# Patient Record
Sex: Female | Born: 1965 | Race: White | Hispanic: No | Marital: Married | Smoking: Current every day smoker
Health system: Southern US, Community
[De-identification: ages and names within clinical notes are randomized; demographics above are authoritative.]

## PROBLEM LIST (undated history)

## (undated) DIAGNOSIS — E669 Obesity, unspecified: Secondary | ICD-10-CM

## (undated) DIAGNOSIS — F17200 Nicotine dependence, unspecified, uncomplicated: Secondary | ICD-10-CM

## (undated) DIAGNOSIS — E119 Type 2 diabetes mellitus without complications: Secondary | ICD-10-CM

## (undated) DIAGNOSIS — M1990 Unspecified osteoarthritis, unspecified site: Secondary | ICD-10-CM

## (undated) DIAGNOSIS — F329 Major depressive disorder, single episode, unspecified: Secondary | ICD-10-CM

## (undated) DIAGNOSIS — N200 Calculus of kidney: Secondary | ICD-10-CM

## (undated) DIAGNOSIS — K589 Irritable bowel syndrome without diarrhea: Secondary | ICD-10-CM

## (undated) DIAGNOSIS — M797 Fibromyalgia: Secondary | ICD-10-CM

## (undated) DIAGNOSIS — N390 Urinary tract infection, site not specified: Secondary | ICD-10-CM

## (undated) DIAGNOSIS — K219 Gastro-esophageal reflux disease without esophagitis: Secondary | ICD-10-CM

## (undated) DIAGNOSIS — E785 Hyperlipidemia, unspecified: Secondary | ICD-10-CM

## (undated) DIAGNOSIS — D259 Leiomyoma of uterus, unspecified: Secondary | ICD-10-CM

## (undated) DIAGNOSIS — T7840XA Allergy, unspecified, initial encounter: Secondary | ICD-10-CM

## (undated) DIAGNOSIS — K602 Anal fissure, unspecified: Secondary | ICD-10-CM

## (undated) DIAGNOSIS — F429 Obsessive-compulsive disorder, unspecified: Secondary | ICD-10-CM

## (undated) DIAGNOSIS — F32A Depression, unspecified: Secondary | ICD-10-CM

## (undated) DIAGNOSIS — Z87442 Personal history of urinary calculi: Secondary | ICD-10-CM

## (undated) DIAGNOSIS — R011 Cardiac murmur, unspecified: Secondary | ICD-10-CM

## (undated) DIAGNOSIS — F319 Bipolar disorder, unspecified: Secondary | ICD-10-CM

## (undated) DIAGNOSIS — I10 Essential (primary) hypertension: Secondary | ICD-10-CM

## (undated) HISTORY — DX: Nicotine dependence, unspecified, uncomplicated: F17.200

## (undated) HISTORY — DX: Bipolar disorder, unspecified: F31.9

## (undated) HISTORY — PX: GASTROPLASTY DUODENAL SWITCH: SHX1699

## (undated) HISTORY — DX: Hyperlipidemia, unspecified: E78.5

## (undated) HISTORY — DX: Type 2 diabetes mellitus without complications: E11.9

## (undated) HISTORY — DX: Depression, unspecified: F32.A

## (undated) HISTORY — DX: Gastro-esophageal reflux disease without esophagitis: K21.9

## (undated) HISTORY — PX: COLONOSCOPY: SHX174

## (undated) HISTORY — DX: Urinary tract infection, site not specified: N39.0

## (undated) HISTORY — DX: Anal fissure, unspecified: K60.2

## (undated) HISTORY — DX: Unspecified osteoarthritis, unspecified site: M19.90

## (undated) HISTORY — DX: Calculus of kidney: N20.0

## (undated) HISTORY — DX: Obesity, unspecified: E66.9

## (undated) HISTORY — DX: Essential (primary) hypertension: I10

## (undated) HISTORY — DX: Major depressive disorder, single episode, unspecified: F32.9

## (undated) HISTORY — PX: TYMPANOPLASTY: SHX33

## (undated) HISTORY — PX: UPPER ENDOSCOPY W/ ANTRODUODENAL MANOMETRY: SHX2602

## (undated) HISTORY — PX: FRACTURE SURGERY: SHX138

## (undated) HISTORY — DX: Irritable bowel syndrome, unspecified: K58.9

## (undated) HISTORY — DX: Allergy, unspecified, initial encounter: T78.40XA

## (undated) HISTORY — DX: Fibromyalgia: M79.7

---

## 1997-07-15 ENCOUNTER — Other Ambulatory Visit: Admission: RE | Admit: 1997-07-15 | Discharge: 1997-07-15 | Payer: Self-pay | Admitting: *Deleted

## 2002-01-21 ENCOUNTER — Emergency Department (HOSPITAL_COMMUNITY): Admission: EM | Admit: 2002-01-21 | Discharge: 2002-01-21 | Payer: Self-pay | Admitting: Emergency Medicine

## 2004-05-06 ENCOUNTER — Ambulatory Visit: Payer: Self-pay | Admitting: Obstetrics & Gynecology

## 2005-08-25 ENCOUNTER — Emergency Department: Payer: Self-pay | Admitting: Emergency Medicine

## 2005-08-27 ENCOUNTER — Emergency Department: Payer: Self-pay | Admitting: Emergency Medicine

## 2005-09-07 ENCOUNTER — Emergency Department: Payer: Self-pay | Admitting: Unknown Physician Specialty

## 2005-09-08 ENCOUNTER — Emergency Department: Payer: Self-pay | Admitting: Emergency Medicine

## 2007-10-11 ENCOUNTER — Ambulatory Visit: Payer: Self-pay | Admitting: Obstetrics & Gynecology

## 2009-01-24 ENCOUNTER — Ambulatory Visit: Payer: Self-pay | Admitting: Family Medicine

## 2009-01-30 ENCOUNTER — Ambulatory Visit: Payer: Self-pay | Admitting: Family Medicine

## 2009-03-12 ENCOUNTER — Ambulatory Visit: Payer: Self-pay | Admitting: Gastroenterology

## 2009-04-07 ENCOUNTER — Ambulatory Visit: Payer: Self-pay | Admitting: Gastroenterology

## 2010-01-08 ENCOUNTER — Encounter: Payer: Self-pay | Admitting: Cardiology

## 2010-03-30 ENCOUNTER — Telehealth (INDEPENDENT_AMBULATORY_CARE_PROVIDER_SITE_OTHER): Payer: Self-pay | Admitting: *Deleted

## 2010-04-02 ENCOUNTER — Ambulatory Visit: Payer: Self-pay | Admitting: Cardiology

## 2010-04-02 DIAGNOSIS — R079 Chest pain, unspecified: Secondary | ICD-10-CM

## 2010-04-07 ENCOUNTER — Telehealth: Payer: Self-pay | Admitting: Cardiology

## 2010-07-09 ENCOUNTER — Ambulatory Visit: Payer: Self-pay | Admitting: Obstetrics & Gynecology

## 2010-07-14 NOTE — Letter (Signed)
Summary: [facility] - Visit  [facility] - Visit   Imported By: Marylou Mccoy 05/21/2010 16:02:11  _____________________________________________________________________  External Attachment:    Type:   Image     Comment:   External Document

## 2010-07-14 NOTE — Letter (Signed)
Summary: Medical Record Release  Medical Record Release   Imported By: Harlon Flor 04/02/2010 13:23:49  _____________________________________________________________________  External Attachment:    Type:   Image     Comment:   External Document

## 2010-07-14 NOTE — Assessment & Plan Note (Signed)
Summary: NEW PT   Visit Type:  Initial Consult Primary Provider:  Lyndon Code.  CC:  c/o left side neck stiffness and left arm pain and tingling down the left arm.  Does have chest pain and shortness of breath.  She is under a lot of stress with taking care of mother-in-law who has alzheimer's.  .  History of Present Illness: 45 yo with history of HTN, smoking, and bipolar disorder presents for evaluation of neck pain.  Patient has had left-sided neck pain for months.  She thinks she developed spasticity in her neck initially as a side effect of Geodon.  She saw a chiropractor 6-7 months ago and her neck pain became worse after she was "adjusted."  The pain radiates down her left arm and into her back.  It is constant and never resolves.  It is like a dull ache.  Pain is not exertional.  She is worried that the pain is coming from her heart.    Patient has lost 120 lbs in the last 10 months, she says, with diet and exercise.  She walks 6 times a week for 45 minutes and swims twice a week.  No exertional dyspnea or chest/neck/arm pain.  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  She gets occasional chest pressure that seems to be related to stress (not exertional).  She smokes about 1 ppd.    ECG: NSR, normal  Preventive Screening-Counseling & Management  Alcohol-Tobacco     Smoking Status: current  Caffeine-Diet-Exercise     Does Patient Exercise: yes      Drug Use:  no.    Current Medications (verified): 1)  Amlodipine Besy-Benazepril Hcl 5-40 Mg Caps (Amlodipine Besy-Benazepril Hcl) .... Two Tablets Daily 2)  Bentyl 20 Mg Tabs (Dicyclomine Hcl) .... As Needed  Allergies (verified): 1)  ! * Psycotic Medications  Past History:  Family History: Last updated: 04/02/2010 Father: Living; CABG x 60's. Mother: Living  Maternal grandmother; CHF; living. Paternal grandmother; CHF & CABG x 4. 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 Smokes 1 PPD x 5 years. Alcohol Use - no Drug Use - no Regular Exercise - yes--swims 2 x weekly for 45 min. Walks on the treadmill 6 days a week for 30 min.  Risk Factors: Exercise: yes (04/02/2010)  Risk Factors: Smoking Status: current (04/02/2010)  Past Medical History: 1. Bipolar disorder, diagnosed 2000 2. Hypertension 3. IBS 4. Obese 5. Smoker  Past Surgical History: upper endoscopy colonoscopy  Family History: Reviewed history and no changes required. Father: Living; CABG x 60's. Mother: Living  Maternal grandmother; CHF; living. Paternal grandmother; CHF & CABG x 4. 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 Smokes 1 PPD x 5 years. Alcohol Use - no Drug Use - no Regular Exercise - yes--swims 2 x weekly for 45 min. Walks on the treadmill 6 days a week for 30 min. Smoking Status:  current Drug Use:  no Does Patient Exercise:  yes  Review of Systems       All systems reviewed and negative except as per HPI.   Vital Signs:  Patient profile:   45 year old female Height:      66 inches Weight:      298 pounds BMI:     48.27 Pulse rate:   97 / minute BP sitting:   135 / 91  (  left arm) Cuff size:   large  Vitals Entered By: Bishop Dublin, CMA (April 02, 2010 10:37 AM)  Physical Exam  General:  Well developed, well nourished, in no acute distress. Head:  normocephalic and atraumatic Nose:  no deformity, discharge, inflammation, or lesions Mouth:  Teeth, gums and palate normal. Oral mucosa normal. Neck:  Neck supple, no JVD. No masses, thyromegaly or abnormal cervical nodes. Lungs:  Clear bilaterally to auscultation and percussion. Heart:  Non-displaced PMI, chest non-tender; regular rate and rhythm, S1, S2 without murmurs, rubs or gallops. Carotid upstroke normal, no bruit.  Pedals normal pulses. No edema, no varicosities. Abdomen:  Bowel  sounds positive; abdomen soft and non-tender without masses, organomegaly, or hernias noted. No hepatosplenomegaly. Msk:  Back normal, normal gait. Muscle strength and tone normal. Extremities:  No clubbing or cyanosis. Neurologic:  Alert and oriented x 3. Skin:  Intact without lesions or rashes. Psych:  Pressured speech.    Impression & Recommendations:  Problem # 1:  CHEST PAIN UNSPECIFIED (ICD-786.50) Very atypical chest, neck, and left arm pain.  She does have some risk factors, HTN and smoking.  I think that the pain is noncardiac.  She is very worried, however.  I will have her do an ETT.  If this is normal, no further workup.   Problem # 2:  SMOKING I strongly encouraged her to quit smoking.   Other Orders: Treadmill (Treadmill)  Patient Instructions: 1)  Your physician recommends that you schedule a follow-up appointment in: as needed  2)  Your physician recommends that you return for a FASTING lipid profile: at your convience  3)  Your physician has requested that you have an exercise tolerance test.  For further information please visit [email or web address]  Please also follow instruction sheet, as given.

## 2010-07-14 NOTE — Progress Notes (Signed)
Summary: No show  Phone Note Outgoing Call   Call placed by: Benedict Needy, RN,  April 07, 2010 10:35 AM Call placed to: Patient Summary of Call: Attempted to call pt no answer after 10+ rings. Pt no showed for ETT this morning.  Initial call taken by: Benedict Needy, RN,  April 07, 2010 10:35 AM

## 2010-07-14 NOTE — Progress Notes (Signed)
Summary: CALLED PT  Phone Note Outgoing Call Call back at [facility] Phone [phone] Call back at Work Phone [phone]   Call placed by: Harlon Flor,  March 30, 2010 11:26 AM Call placed to: Patient Summary of Call: CALLED PT TO RESCHEDULE APPT THIS AFTERNOON-THE HOME PHONE # IS DISCONNECTED AND THE WORK # IS INCORRECT Initial call taken by: Harlon Flor,  March 30, 2010 11:27 AM

## 2010-12-29 ENCOUNTER — Encounter: Payer: Self-pay | Admitting: Cardiology

## 2011-01-22 ENCOUNTER — Ambulatory Visit: Payer: Self-pay | Admitting: Cardiovascular Disease

## 2011-01-29 ENCOUNTER — Telehealth: Payer: Self-pay | Admitting: *Deleted

## 2011-02-01 ENCOUNTER — Ambulatory Visit: Payer: Self-pay | Admitting: Cardiovascular Disease

## 2011-02-04 NOTE — Telephone Encounter (Signed)
Error

## 2011-02-08 ENCOUNTER — Ambulatory Visit: Payer: Self-pay | Admitting: Obstetrics & Gynecology

## 2011-05-12 ENCOUNTER — Ambulatory Visit: Payer: Self-pay | Admitting: Urology

## 2011-06-15 HISTORY — PX: BREAST BIOPSY: SHX20

## 2011-09-13 ENCOUNTER — Ambulatory Visit: Payer: Self-pay | Admitting: Physician Assistant

## 2011-11-17 ENCOUNTER — Ambulatory Visit: Payer: Self-pay

## 2012-07-23 IMAGING — US ABDOMEN ULTRASOUND LIMITED
1 series · 17 of 25 positions shown · non-contrast
Comparison: none

REASON FOR EXAM: RUQ pain   abd pain  nausea
COMMENTS:

PROCEDURE:     JUMPER - JUMPER ABDOMEN UPPER GENERAL  - September 13, 2011 [DATE]
RESULT:     History: Pain.
Comparison Study: Prior ultrasound of 01/30/2009. Prior CT of 05/12/2011.

[Series 1: abdomen ultrasound limited · 17 of 63 slices shown]
[im 1/63]
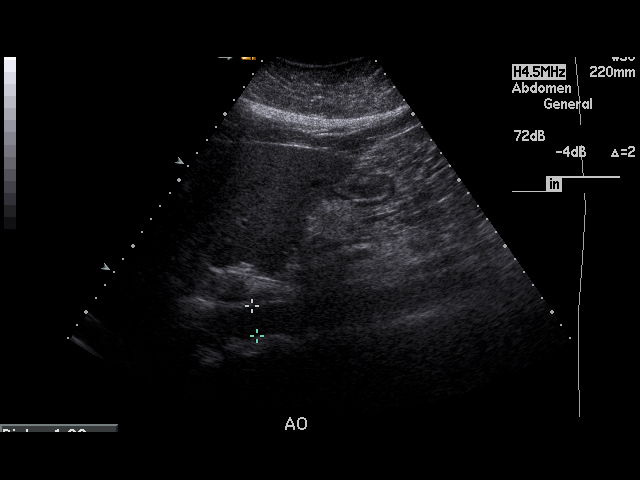
[im 6/63]
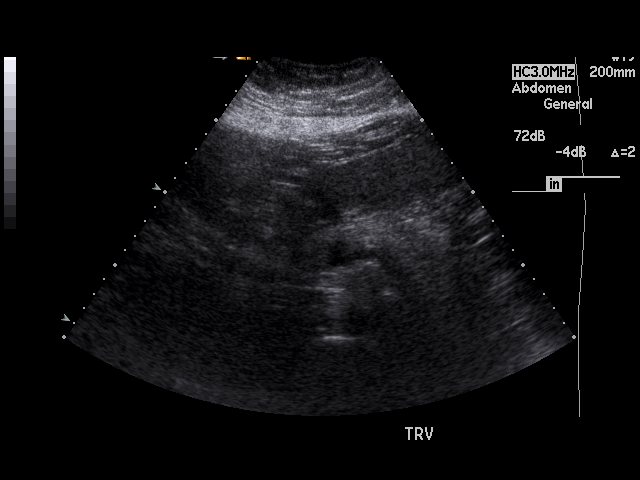
[im 8/63]
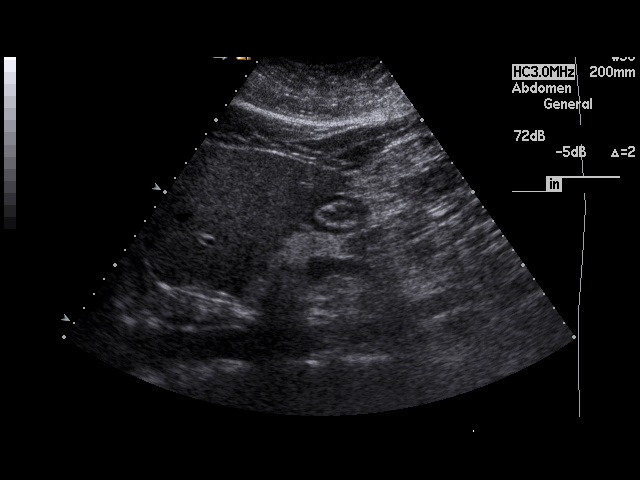
[im 13/63]
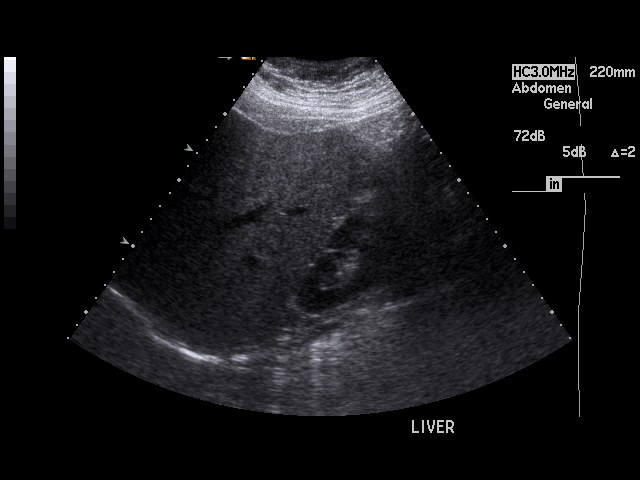
[im 16/63]
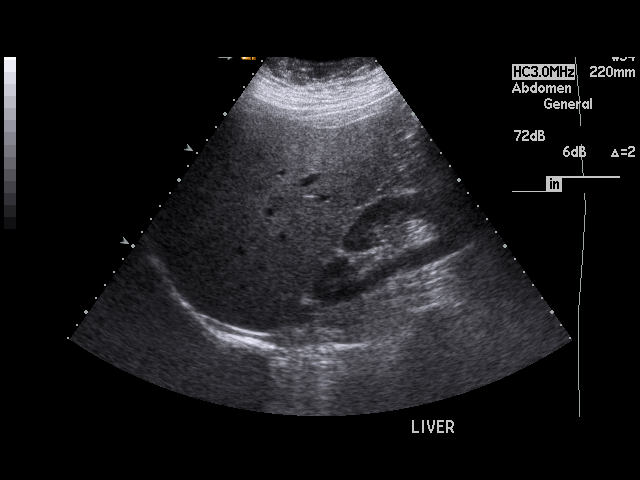
[im 21/63]
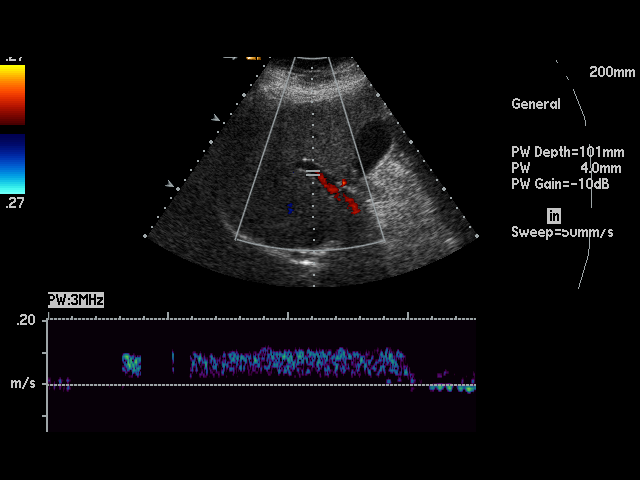
[im 24/63]
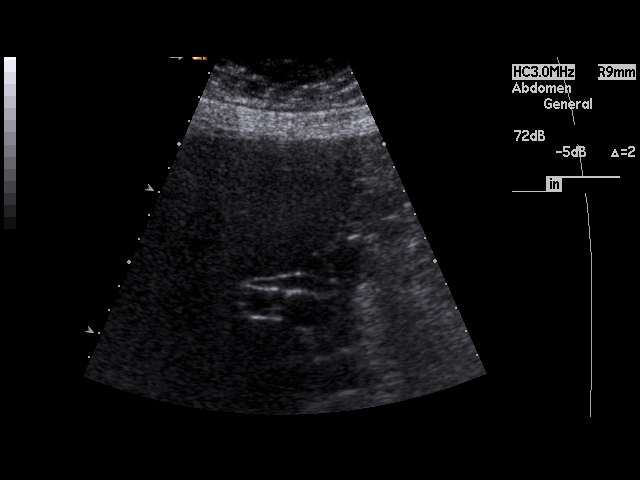
[im 29/63]
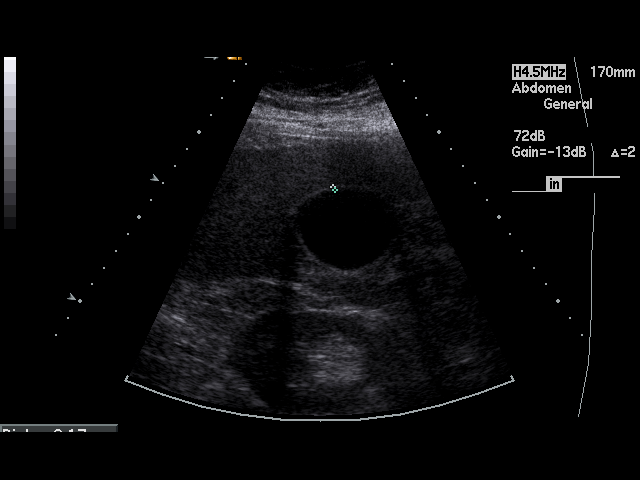
[im 32/63]
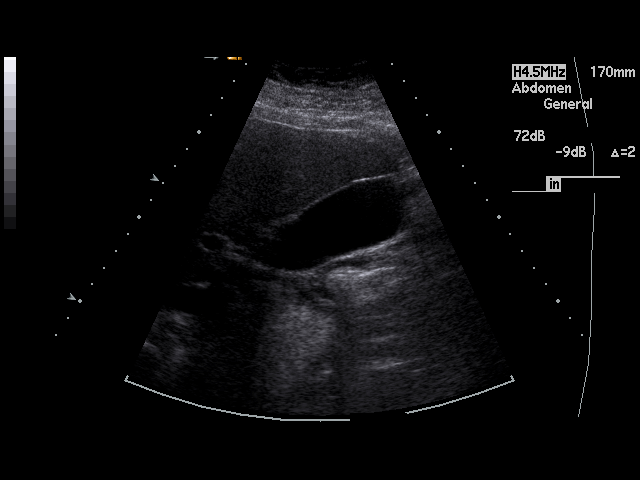
[im 34/63]
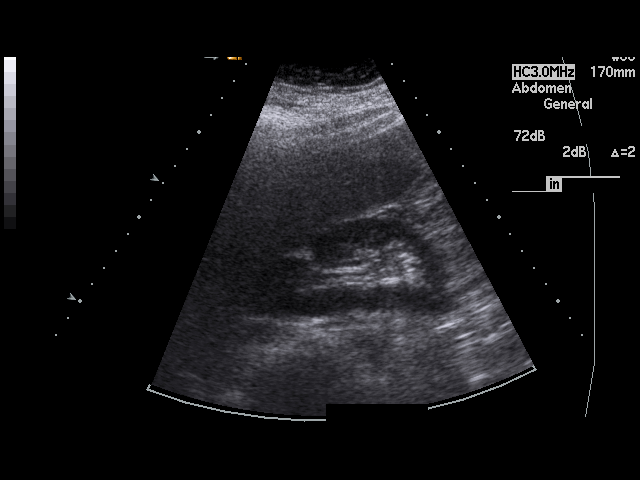
[im 39/63]
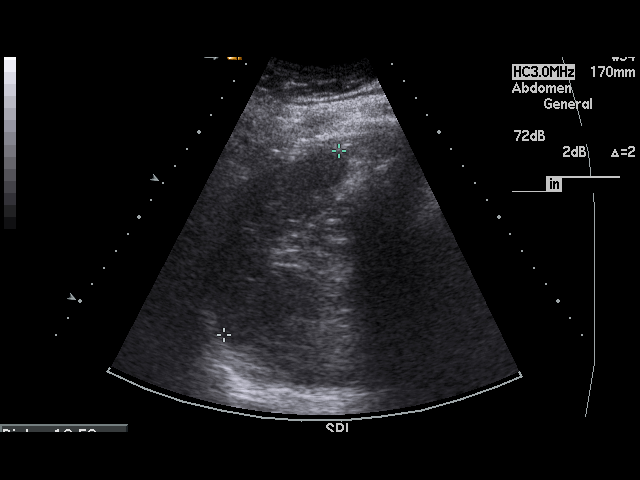
[im 42/63]
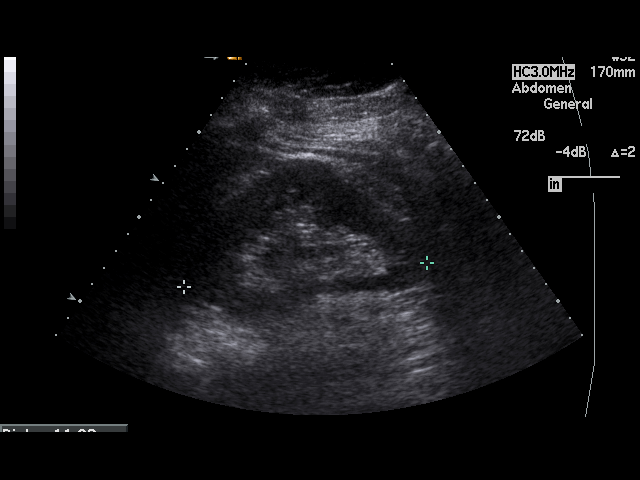
[im 47/63]
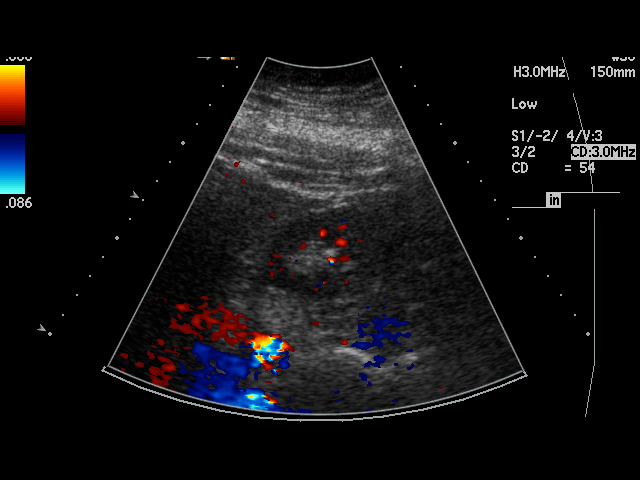
[im 50/63]
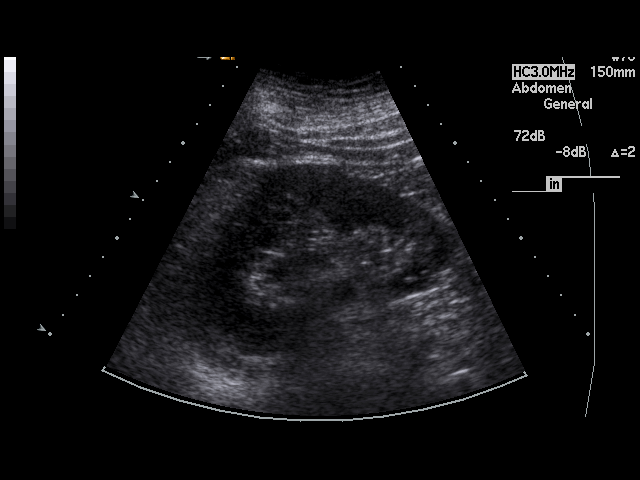
[im 55/63]
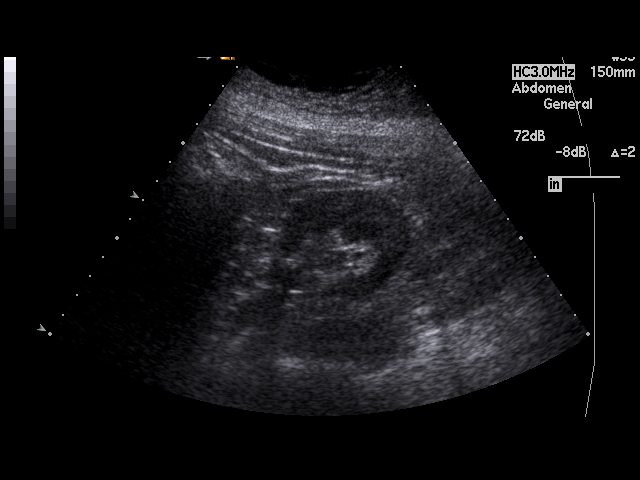
[im 57/63]
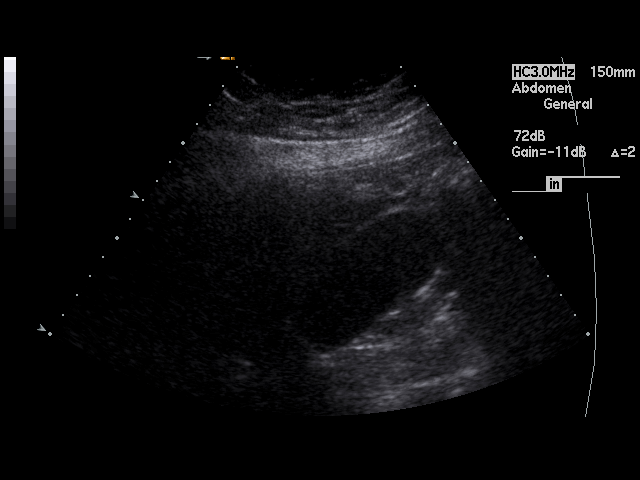
[im 63/63]
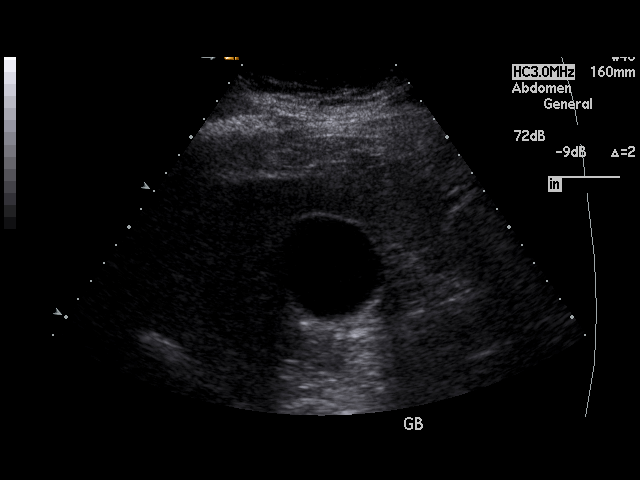

[17 of 25 positions shown; findings below may reference images not displayed]

FINDINGS: Liver mildly echogenic. Pancreas normal. Gallbladder normal.
Negative Murphy's sign. Gallbladder wall thickness 1.7 mm. Common bile duct
diameter 5.2 mm. No hydronephrosis. Bilateral nephrolithiasis present.
IMPRESSION: Slightly echogenic liver, improved from 0646. Otherwise
unremarkable exam.

## 2012-11-29 ENCOUNTER — Ambulatory Visit: Payer: Self-pay | Admitting: Family Medicine

## 2016-08-02 ENCOUNTER — Ambulatory Visit: Payer: Self-pay | Admitting: Family

## 2016-08-02 ENCOUNTER — Telehealth: Payer: Self-pay | Admitting: Family

## 2016-08-02 DIAGNOSIS — Z0289 Encounter for other administrative examinations: Secondary | ICD-10-CM

## 2016-08-02 NOTE — Telephone Encounter (Signed)
FYI- Reason why patient canceled.

## 2016-08-02 NOTE — Telephone Encounter (Signed)
FYI - Pt spouse got called into work and they only have one vehicle.

## 2016-09-07 ENCOUNTER — Ambulatory Visit: Payer: Self-pay | Admitting: Family

## 2016-09-07 ENCOUNTER — Telehealth: Payer: Self-pay | Admitting: Family

## 2016-09-07 NOTE — Telephone Encounter (Signed)
FYI

## 2016-09-07 NOTE — Telephone Encounter (Signed)
Pt cancelled feeling so much better and wants someone else to have the spot that needs it.

## 2016-09-22 ENCOUNTER — Ambulatory Visit: Payer: Self-pay | Admitting: Family

## 2016-09-30 ENCOUNTER — Encounter: Payer: Self-pay | Admitting: Family

## 2016-09-30 ENCOUNTER — Ambulatory Visit (INDEPENDENT_AMBULATORY_CARE_PROVIDER_SITE_OTHER): Payer: BLUE CROSS/BLUE SHIELD | Admitting: Family

## 2016-09-30 VITALS — BP 150/98 | HR 100 | Temp 99.6°F | Ht 66.0 in | Wt >= 6400 oz

## 2016-09-30 DIAGNOSIS — K589 Irritable bowel syndrome without diarrhea: Secondary | ICD-10-CM | POA: Diagnosis not present

## 2016-09-30 DIAGNOSIS — I10 Essential (primary) hypertension: Secondary | ICD-10-CM | POA: Diagnosis not present

## 2016-09-30 MED ORDER — BENAZEPRIL HCL 20 MG PO TABS: 20.0000 mg | ORAL_TABLET | Freq: Every day | ORAL | 3 refills | Status: DC

## 2016-09-30 MED ORDER — AMLODIPINE BESYLATE 5 MG PO TABS: 5.0000 mg | ORAL_TABLET | Freq: Every day | ORAL | 0 refills | Status: DC

## 2016-09-30 NOTE — Patient Instructions (Addendum)
Increase lisinopril Monitor BP , goal less than 140/90.  Labs today and again Monday or Tuesday morning   Walking program 10  Minutes EVERY DAY and then increase to 20 minutes every day the next week.   This is  Dr. Lupita Dawn  example of a  "Low GI"  Diet:  It will allow you to lose 4 to 8  lbs  per month if you follow it carefully.  Your goal with exercise is a minimum of 30 minutes of aerobic exercise 5 days per week (Walking does not count once it becomes easy!)    All of the foods can be found at grocery stores and in bulk at [employer].  The Atkins protein bars and shakes are available in more varieties at Target, [employer] and [employer].     7 AM Breakfast:  Choose from the following:  Low carbohydrate Protein  Shakes (I recommend the  Premier Protein chocolate shakes,  EAS AdvantEdge "Carb Control" shakes  Or the Atkins shakes all are under 3 net carbs)     a scrambled egg/bacon/cheese burrito made with Mission's "carb balance" whole wheat tortilla  (about 10 net carbs )  Regulatory affairs officer (basically a quiche without the pastry crust) that is eaten cold and very convenient way to get your eggs.  8 carbs)  If you make your own protein shakes, avoid bananas and pineapple,  And use low carb greek yogurt or original /unsweetened almond or soy milk    Avoid cereal and bananas, oatmeal and cream of wheat and grits. They are loaded with carbohydrates!   10 AM: high protein snack:  Protein bar by Atkins (the snack size, under 200 cal, usually < 6 net carbs).    A stick of cheese:  Around 1 carb,  100 cal     Dannon Light n Fit [place] Yogurt  (80 cal, 8 carbs)  Other so called "protein bars" and Greek yogurts tend to be loaded with carbohydrates.  Remember, in food advertising, the word "energy" is synonymous for " carbohydrate."  Lunch:   A Sandwich using the bread choices listed, Can use any  Eggs,  lunchmeat, grilled meat or canned tuna), avocado, regular  mayo/mustard  and cheese.  A Salad using blue cheese, ranch,  Goddess or vinagrette,  Avoid taco shells, croutons or "confetti" and no "candied nuts" but regular nuts OK.   No pretzels, nabs  or chips.  Pickles and miniature sweet peppers are a good low carb alternative that provide a "crunch"  The bread is the only source of carbohydrate in a sandwich and  can be decreased by trying some of the attached alternatives to traditional loaf bread   Avoid "Low fat dressings, as well as [place] dressings They are loaded with sugar!   3 PM/ Mid day  Snack:  Consider  1 ounce of  almonds, walnuts, pistachios, pecans, peanuts,  Macadamia nuts or a nut medley.  Avoid "granola and granola bars "  Mixed nuts are ok in moderation as long as there are no raisins,  cranberries or dried fruit.   KIND bars are OK if you get the low glycemic index variety   Try the prosciutto/mozzarella cheese sticks by Fiorruci  In deli /backery section   High protein      6 PM  Dinner:     Meat/fowl/fish with a green salad, and either broccoli, cauliflower, green beans, spinach, brussel sprouts or  Lima beans. DO  NOT BREAD THE PROTEIN!!      There is a low carb pasta by Dreamfield's that is acceptable and tastes great: only 5 digestible carbs/serving.( All grocery stores but BJs carry it ) Several ready made meals are available low carb:   Try Michel Angelo's chicken piccata or chicken or eggplant parm over low carb pasta.(Lowes and BJs)   Marjory Lies Sanchez's "Carnitas" (pulled pork, no sauce,  0 carbs) or his beef pot roast to make a dinner burrito (at [employer]J's)  Pesto over low carb pasta (bj's sells a good quality pesto in the center refrigerated section of the deli   Try satueeing  Cheral Marker with mushroooms as a good side   [employer] Giant makes a mashed cauliflower that tastes like mashed potatoes  Whole wheat pasta is still full of digestible carbs and  Not as low in glycemic index as Dreamfield's.   Brown  rice is still rice,  So skip the rice and noodles if you eat [place] or [place] (or at least limit to 1/2 cup)  9 PM snack :   Breyer's "low carb" fudgsicle or  ice cream bar (Carb Smart line), or  Weight Watcher's ice cream bar , or another "no sugar added" ice cream;  a serving of fresh berries/cherries with whipped cream   Cheese or DANNON'S LlGHT N FIT GREEK YOGURT  8 ounces of [place] unsweetened almond/cococunut milk    Treat yourself to a parfait made with whipped cream blueberiies, walnuts and vanilla greek yogurt  Avoid bananas, pineapple, grapes  and watermelon on a regular basis because they are high in sugar.  THINK OF THEM AS DESSERT  Remember that snack Substitutions should be less than 10 NET carbs per serving and meals < 20 carbs. Remember to subtract fiber grams to get the "net carbs."    DASH Eating Plan DASH stands for "Dietary Approaches to Stop Hypertension." The DASH eating plan is a healthy eating plan that has been shown to reduce high blood pressure (hypertension). It may also reduce your risk for type 2 diabetes, heart disease, and stroke. The DASH eating plan may also help with weight loss. What are tips for following this plan? General guidelines   Avoid eating more than 2,300 mg (milligrams) of salt (sodium) a day. If you have hypertension, you may need to reduce your sodium intake to 1,500 mg a day.  Limit alcohol intake to no more than 1 drink a day for nonpregnant women and 2 drinks a day for men. One drink equals 12 oz of beer, 5 oz of wine, or 1 oz of hard liquor.  Work with your health care provider to maintain a healthy body weight or to lose weight. Ask what an ideal weight is for you.  Get at least 30 minutes of exercise that causes your heart to beat faster (aerobic exercise) most days of the week. Activities may include walking, swimming, or biking.  Work with your health care provider or diet and nutrition specialist (dietitian) to adjust your  eating plan to your individual calorie needs. Reading food labels   Check food labels for the amount of sodium per serving. Choose foods with less than 5 percent of the Daily Value of sodium. Generally, foods with less than 300 mg of sodium per serving fit into this eating plan.  To find whole grains, look for the word "whole" as the first word in the ingredient list. Shopping   Buy products labeled as "low-sodium" or "no salt added."  Buy fresh foods. Avoid canned foods and premade or frozen meals. Cooking   Avoid adding salt when cooking. Use salt-free seasonings or herbs instead of table salt or sea salt. Check with your health care provider or pharmacist before using salt substitutes.  Do not fry foods. Cook foods using healthy methods such as baking, boiling, grilling, and broiling instead.  Cook with heart-healthy oils, such as olive, canola, soybean, or sunflower oil. Meal planning    Eat a balanced diet that includes:  5 or more servings of fruits and vegetables each day. At each meal, try to fill half of your plate with fruits and vegetables.  Up to 6-8 servings of whole grains each day.  Less than 6 oz of lean meat, poultry, or fish each day. A 3-oz serving of meat is about the same size as a deck of cards. One egg equals 1 oz.  2 servings of low-fat dairy each day.  A serving of nuts, seeds, or beans 5 times each week.  Heart-healthy fats. Healthy fats called Omega-3 fatty acids are found in foods such as flaxseeds and coldwater fish, like sardines, salmon, and mackerel.  Limit how much you eat of the following:  Canned or prepackaged foods.  Food that is high in trans fat, such as fried foods.  Food that is high in saturated fat, such as fatty meat.  Sweets, desserts, sugary drinks, and other foods with added sugar.  Full-fat dairy products.  Do not salt foods before eating.  Try to eat at least 2 vegetarian meals each week.  Eat more home-cooked food  and less restaurant, buffet, and fast food.  When eating at a restaurant, ask that your food be prepared with less salt or no salt, if possible. What foods are recommended? The items listed may not be a complete list. Talk with your dietitian about what dietary choices are best for you. Grains  Whole-grain or whole-wheat bread. Whole-grain or whole-wheat pasta. Brown rice. [place]. Bulgur. Whole-grain and low-sodium cereals. Pita bread. Low-fat, low-sodium crackers. Whole-wheat flour tortillas. Vegetables  Fresh or frozen vegetables (raw, steamed, roasted, or grilled). Low-sodium or reduced-sodium tomato and vegetable juice. Low-sodium or reduced-sodium tomato sauce and tomato paste. Low-sodium or reduced-sodium canned vegetables. Fruits  All fresh, dried, or frozen fruit. Canned fruit in natural juice (without added sugar). Meat and other protein foods  Skinless chicken or [place]. Ground chicken or [place]. Pork with fat trimmed off. Fish and seafood. Egg whites. Dried beans, peas, or lentils. Unsalted nuts, nut butters, and seeds. Unsalted canned beans. Lean cuts of beef with fat trimmed off. Low-sodium, lean deli meat. Dairy  Low-fat (1%) or fat-free (skim) milk. Fat-free, low-fat, or reduced-fat cheeses. Nonfat, low-sodium ricotta or cottage cheese. Low-fat or nonfat yogurt. Low-fat, low-sodium cheese. Fats and oils  Soft margarine without trans fats. Vegetable oil. Low-fat, reduced-fat, or light mayonnaise and salad dressings (reduced-sodium). Canola, safflower, olive, soybean, and sunflower oils. Avocado. Seasoning and other foods  Herbs. Spices. Seasoning mixes without salt. Unsalted popcorn and pretzels. Fat-free sweets. What foods are not recommended? The items listed may not be a complete list. Talk with your dietitian about what dietary choices are best for you. Grains  Baked goods made with fat, such as croissants, muffins, or some breads. Dry pasta or rice meal  packs. Vegetables  Creamed or fried vegetables. Vegetables in a cheese sauce. Regular canned vegetables (not low-sodium or reduced-sodium). Regular canned tomato sauce and paste (not low-sodium or reduced-sodium). Regular tomato and vegetable  juice (not low-sodium or reduced-sodium). Angie Fava. Olives. Fruits  Canned fruit in a light or heavy syrup. Fried fruit. Fruit in cream or butter sauce. Meat and other protein foods  Fatty cuts of meat. Ribs. Fried meat. Berniece Salines. Sausage. Bologna and other processed lunch meats. Salami. Fatback. Hotdogs. Bratwurst. Salted nuts and seeds. Canned beans with added salt. Canned or smoked fish. Whole eggs or egg yolks. Chicken or [place] with skin. Dairy  Whole or 2% milk, cream, and half-and-half. Whole or full-fat cream cheese. Whole-fat or sweetened yogurt. Full-fat cheese. Nondairy creamers. Whipped toppings. Processed cheese and cheese spreads. Fats and oils  Butter. Stick margarine. Lard. Shortening. Ghee. Bacon fat. Tropical oils, such as coconut, palm kernel, or palm oil. Seasoning and other foods  Salted popcorn and pretzels. Onion salt, garlic salt, seasoned salt, table salt, and sea salt. Worcestershire sauce. Tartar sauce. Barbecue sauce. Teriyaki sauce. Soy sauce, including reduced-sodium. Steak sauce. Canned and packaged gravies. Fish sauce. Oyster sauce. Cocktail sauce. Horseradish that you find on the shelf. Ketchup. Mustard. Meat flavorings and tenderizers. Bouillon cubes. Hot sauce and Tabasco sauce. Premade or packaged marinades. Premade or packaged taco seasonings. Relishes. Regular salad dressings. Where to find more information:  National Heart, Lung, and [place]: https://wilson-eaton.com/  American Heart Association: www.heart.org Summary  The DASH eating plan is a healthy eating plan that has been shown to reduce high blood pressure (hypertension). It may also reduce your risk for type 2 diabetes, heart disease, and stroke.  With the DASH  eating plan, you should limit salt (sodium) intake to 2,300 mg a day. If you have hypertension, you may need to reduce your sodium intake to 1,500 mg a day.  When on the DASH eating plan, aim to eat more fresh fruits and vegetables, whole grains, lean proteins, low-fat dairy, and heart-healthy fats.  Work with your health care provider or diet and nutrition specialist (dietitian) to adjust your eating plan to your individual calorie needs. This information is not intended to replace advice given to you by your health care provider. Make sure you discuss any questions you have with your health care provider. Document Released: 05/20/2011 Document Revised: 05/24/2016 Document Reviewed: 05/24/2016 Elsevier Interactive Patient Education  2017 [employer].

## 2016-09-30 NOTE — Progress Notes (Signed)
Pre visit review using our clinic review tool, if applicable. No additional management support is needed unless otherwise documented below in the visit note. 

## 2016-09-30 NOTE — Progress Notes (Signed)
Subjective:    Patient ID: Kathryn Schneider, female    DOB: [birth year], 51 y.o.   MRN: [id number]  CC: Kathryn Schneider is a 51 y.o. female who presents today to establish care.    HPI: From [facility] in [place], last PCP in 2011.   Weight gain- most frustrated by this. 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 Not exercising currently. Skips meals and eats big dinner. Complains of diet causing a lot of gas and abdominal bloating, worsening IBS.   IBS- fluctuates between diarrhea and constipation. Has seen GI, takes zofran. Last colonoscopy 2011 at [facility]. 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   HTN- Compliant with medication.Denies exertional chest pain or pressure, numbness or tingling radiating to left arm or jaw, palpitations, dizziness, frequent headaches, changes in vision, or shortness of breath.    Will return for pap. Due for CPE, mammogram.       HISTORY:  Past Medical History:  Diagnosis Date  . Allergy   . Anal fissure   . Arthritis   . Bipolar disorder ([place])    diagnosed 2000  . Depression   . Diabetes ([place])   . Fibromyalgia   . GERD (gastroesophageal reflux disease)   . HTN (hypertension)   . Hyperlipidemia   . IBS (irritable bowel syndrome)   . Kidney stones   . Obese   . Smoker   . UTI (urinary tract infection)    Past Surgical History:  Procedure Laterality Date  . BREAST BIOPSY  2013  . COLONOSCOPY    . UPPER ENDOSCOPY W/ ANTRODUODENAL MANOMETRY     Family History  Problem Relation Age of Onset  . Depression Mother   . Arthritis Mother   . Osteoporosis Mother   . Vasculitis Father     wegners,   . Cataracts Father   . Atrial fibrillation Father   . Diabetes Father   . Heart disease Father   . Arthritis Father   . Heart disease Maternal Grandmother   . Heart disease Paternal Grandmother     Allergies: Patient has no allergy information on record. No current outpatient prescriptions on file prior to  visit.   No current facility-administered medications on file prior to visit.     Social History  Substance Use Topics  . Smoking status: Current Every Day Smoker  . Smokeless tobacco: Not on file     Comment: 1 ppd x 5 years  . Alcohol use No    Review of Systems  Constitutional: Negative for chills and fever.  Eyes: Negative for visual disturbance.  Respiratory: Negative for cough.   Cardiovascular: Negative for chest pain and palpitations.  Gastrointestinal: Positive for constipation and diarrhea. Negative for abdominal pain, nausea and vomiting.  Musculoskeletal: Negative for arthralgias and myalgias.  Skin: Negative for rash.  Neurological: Negative for headaches.  Hematological: Negative for adenopathy.      Objective:    BP (!) 150/98   Pulse 100   Temp 99.6 F (37.6 C) (Oral)   Ht 5\' 6"  (1.676 m)   Wt (!) 425 lb 3.2 oz (192.9 kg)   SpO2 95%   BMI 68.63 kg/m  BP Readings from Last 3 Encounters:  09/30/16 (!) 150/98  04/02/10 (!) 135/91   Wt Readings from Last 3 Encounters:  09/30/16 (!) 425 lb 3.2 oz (192.9 kg)  04/02/10 (!) 298 lb (135.2 kg)    Physical Exam  Constitutional: She appears well-developed and  well-nourished.  Eyes: Conjunctivae are normal.  Cardiovascular: Normal rate, regular rhythm, normal heart sounds and normal pulses.   Pulmonary/Chest: Effort normal and breath sounds normal. She has no wheezes. She has no rhonchi. She has no rales.  Neurological: She is alert.  Skin: Skin is warm and dry.  Psychiatric: She has a normal mood and affect. Her speech is normal and behavior is normal. Thought content normal.  Vitals reviewed.      Assessment & Plan:   Problem List Items Addressed This Visit      Cardiovascular and Mediastinum   HTN (hypertension) - Primary    Elevated today. Will increase lotensin. Pending bmp today and then repeat in 5 days. Follow up one month.       Relevant Medications   amLODipine (NORVASC) 5 MG tablet    benazepril (LOTENSIN) 20 MG tablet   Other Relevant Orders   Comprehensive metabolic panel (Completed)   Basic metabolic panel     Digestive   IBS (irritable bowel syndrome)    Discussed weight gain and diet as likely contributors. Agreed to focus on weight management to help with symptoms of IBS.       Relevant Medications   ondansetron (ZOFRAN-ODT) 4 MG disintegrating tablet     Other   Morbid obesity ([place])    Most important complaint to address, patient and I agree this is priority. We spent a lot of time discussing dietary changes ( information given) and I have advised to start walking program. f/u one month.           I have discontinued [name]'s amLODipine-benazepril, dicyclomine, and benazepril. I have also changed her amLODipine. Additionally, I am having her start on benazepril. Lastly, I am having her maintain her ondansetron.   Meds ordered this encounter  Medications  . ondansetron (ZOFRAN-ODT) 4 MG disintegrating tablet    Sig: Take by mouth.  . DISCONTD: benazepril (LOTENSIN) 10 MG tablet  . DISCONTD: amLODipine (NORVASC) 5 MG tablet  . amLODipine (NORVASC) 5 MG tablet    Sig: Take 1 tablet (5 mg total) by mouth daily.    Dispense:  90 tablet    Refill:  0    Order Specific Question:   Supervising Provider    Answer:   Kathryn Schneider [[id number]]  . benazepril (LOTENSIN) 20 MG tablet    Sig: Take 1 tablet (20 mg total) by mouth daily.    Dispense:  90 tablet    Refill:  3    Order Specific Question:   Supervising Provider    Answer:   Kathryn Schneider [[id number]]    Return precautions given.   Risks, benefits, and alternatives of the medications and treatment plan prescribed today were discussed, and patient expressed understanding.   Education regarding symptom management and diagnosis given to patient on AVS.  Continue to follow with Kathryn Paris, FNP for routine health maintenance.   Kathryn Schneider and I agreed with plan.   Kathryn Paris,  FNP

## 2016-10-01 LAB — COMPREHENSIVE METABOLIC PANEL
ALBUMIN: 4.2 g/dL (ref 3.5–5.2)
ALK PHOS: 71 U/L (ref 39–117)
ALT: 29 U/L (ref 0–35)
AST: 21 U/L (ref 0–37)
BILIRUBIN TOTAL: 0.3 mg/dL (ref 0.2–1.2)
BUN: 17 mg/dL (ref 6–23)
CALCIUM: 9.1 mg/dL (ref 8.4–10.5)
CO2: 31 meq/L (ref 19–32)
CREATININE: 0.79 mg/dL (ref 0.40–1.20)
Chloride: 102 mEq/L (ref 96–112)
GFR: 82.07 mL/min (ref >60.00)
Glucose, Bld: 106 mg/dL — ABNORMAL HIGH (ref 70–99)
Potassium: 4.1 mEq/L (ref 3.5–5.1)
Sodium: 138 mEq/L (ref 135–145)
TOTAL PROTEIN: 7.7 g/dL (ref 6.0–8.3)

## 2016-10-04 ENCOUNTER — Encounter: Payer: Self-pay | Admitting: Family

## 2016-10-04 DIAGNOSIS — K589 Irritable bowel syndrome without diarrhea: Secondary | ICD-10-CM | POA: Insufficient documentation

## 2016-10-05 ENCOUNTER — Encounter: Payer: Self-pay | Admitting: Family

## 2016-10-05 DIAGNOSIS — I10 Essential (primary) hypertension: Secondary | ICD-10-CM | POA: Insufficient documentation

## 2016-10-05 NOTE — Assessment & Plan Note (Signed)
Discussed weight gain and diet as likely contributors. Agreed to focus on weight management to help with symptoms of IBS.

## 2016-10-05 NOTE — Assessment & Plan Note (Signed)
Most important complaint to address, patient and I agree this is priority. We spent a lot of time discussing dietary changes ( information given) and I have advised to start walking program. f/u one month.

## 2016-10-05 NOTE — Assessment & Plan Note (Signed)
Elevated today. Will increase lotensin. Pending bmp today and then repeat in 5 days. Follow up one month.

## 2016-10-06 ENCOUNTER — Other Ambulatory Visit: Payer: PRIVATE HEALTH INSURANCE

## 2016-10-27 ENCOUNTER — Other Ambulatory Visit: Payer: BLUE CROSS/BLUE SHIELD

## 2016-10-28 ENCOUNTER — Other Ambulatory Visit: Payer: BLUE CROSS/BLUE SHIELD

## 2016-10-29 ENCOUNTER — Other Ambulatory Visit: Payer: BLUE CROSS/BLUE SHIELD

## 2016-10-29 NOTE — Addendum Note (Signed)
Addended by: Arby Barrette on: 10/29/2016 11:11 AM   Modules accepted: Orders

## 2016-11-01 ENCOUNTER — Ambulatory Visit: Payer: BLUE CROSS/BLUE SHIELD | Admitting: Family

## 2016-11-15 ENCOUNTER — Ambulatory Visit (INDEPENDENT_AMBULATORY_CARE_PROVIDER_SITE_OTHER): Payer: BLUE CROSS/BLUE SHIELD | Admitting: Family

## 2016-11-15 VITALS — BP 160/100 | HR 98 | Temp 98.1°F | Ht 66.0 in | Wt >= 6400 oz

## 2016-11-15 DIAGNOSIS — I10 Essential (primary) hypertension: Secondary | ICD-10-CM | POA: Diagnosis not present

## 2016-11-15 DIAGNOSIS — Z1239 Encounter for other screening for malignant neoplasm of breast: Secondary | ICD-10-CM

## 2016-11-15 DIAGNOSIS — Z1211 Encounter for screening for malignant neoplasm of colon: Secondary | ICD-10-CM

## 2016-11-15 DIAGNOSIS — Z1231 Encounter for screening mammogram for malignant neoplasm of breast: Secondary | ICD-10-CM

## 2016-11-15 MED ORDER — HYDROCHLOROTHIAZIDE 12.5 MG PO CAPS: 12.5000 mg | ORAL_CAPSULE | Freq: Every day | ORAL | 0 refills | Status: DC

## 2016-11-15 NOTE — Patient Instructions (Signed)
Start hctz  Repeat electrolytes on Friday  Follow up with physical

## 2016-11-15 NOTE — Progress Notes (Signed)
Subjective:    Patient ID: Kathryn Schneider, female    DOB: [birth year], 51 y.o.   MRN: [id number]  CC: Kathryn Schneider is a 51 y.o. female who presents today for follow up.   HPI: 2 months ago started norvasc and lotensin.   Weight loss - lost 3 inches off waist. Lost 4 pounds.  Eating healthier and has starting swimming.   HTN-compliant with medication. Does not leg swelling around ankles, worse at end of day.   Denies exertional chest pain or pressure, numbness or tingling radiating to left arm or jaw, palpitations, dizziness, frequent headaches, changes in vision, or shortness of breath.        HISTORY:  Past Medical History:  Diagnosis Date  . Allergy   . Anal fissure   . Arthritis   . Bipolar disorder ([place])    diagnosed 2000  . Depression   . Diabetes ([place])   . Fibromyalgia   . GERD (gastroesophageal reflux disease)   . HTN (hypertension)   . Hyperlipidemia   . IBS (irritable bowel syndrome)   . Kidney stones   . Obese   . Smoker   . UTI (urinary tract infection)    Past Surgical History:  Procedure Laterality Date  . BREAST BIOPSY  2013  . COLONOSCOPY    . UPPER ENDOSCOPY W/ ANTRODUODENAL MANOMETRY     Family History  Problem Relation Age of Onset  . Depression Mother   . Arthritis Mother   . Osteoporosis Mother   . Vasculitis Father        wegners,   . Cataracts Father   . Atrial fibrillation Father   . Diabetes Father   . Heart disease Father   . Arthritis Father   . Heart disease Maternal Grandmother   . Heart disease Paternal Grandmother     Allergies: Patient has no allergy information on record. Current Outpatient Prescriptions on File Prior to Visit  Medication Sig Dispense Refill  . amLODipine (NORVASC) 5 MG tablet Take 1 tablet (5 mg total) by mouth daily. 90 tablet 0  . benazepril (LOTENSIN) 20 MG tablet Take 1 tablet (20 mg total) by mouth daily. 90 tablet 3   No current facility-administered medications on file prior to visit.      Social History  Substance Use Topics  . Smoking status: Current Every Day Smoker  . Smokeless tobacco: Never Used     Comment: 1 ppd x 5 years  . Alcohol use No    Review of Systems  Constitutional: Negative for chills and fever.  Eyes: Negative for visual disturbance.  Respiratory: Negative for cough and shortness of breath.   Cardiovascular: Positive for leg swelling. Negative for chest pain and palpitations.  Gastrointestinal: Negative for nausea and vomiting.  Neurological: Negative for headaches.      Objective:    BP (!) 160/100   Pulse 98   Temp 98.1 F (36.7 C) (Oral)   Ht 5\' 6"  (1.676 m)   Wt (!) 421 lb 6.4 oz (191.1 kg)   SpO2 97%   BMI 68.02 kg/m  BP Readings from Last 3 Encounters:  11/15/16 (!) 160/100  09/30/16 (!) 150/98  04/02/10 (!) 135/91   Wt Readings from Last 3 Encounters:  11/15/16 (!) 421 lb 6.4 oz (191.1 kg)  09/30/16 (!) 425 lb 3.2 oz (192.9 kg)  04/02/10 (!) 298 lb (135.2 kg)    Physical Exam  Constitutional: She appears well-developed and well-nourished.  Eyes: Conjunctivae are normal.  Cardiovascular: Normal rate, regular rhythm, normal heart sounds and normal pulses.   Trace pedal bilateral edema, non pitting. No palpable cords or masses. No erythema or increased warmth. No asymmetry in calf size when compared bilaterally LE hair growth symmetric and present. No discoloration of varicosities noted. LE warm and palpable pedal pulses.     Pulmonary/Chest: Effort normal and breath sounds normal. She has no wheezes. She has no rhonchi. She has no rales.  Neurological: She is alert.  Skin: Skin is warm and dry.  Psychiatric: She has a normal mood and affect. Her speech is normal and behavior is normal. Thought content normal.  Vitals reviewed.      Assessment & Plan:   Problem List Items Addressed This Visit      Cardiovascular and Mediastinum   HTN (hypertension) - Primary    Not at goal. Addede hctz due to LE swelling.  Pending repeat bmp. Follow up 2 weeks.       Relevant Medications   hydrochlorothiazide (MICROZIDE) 12.5 MG capsule   Other Relevant Orders   Basic metabolic panel (Completed)   Basic metabolic panel     Other   Morbid obesity ([place])    Congratulated patient on notable lifestyle changes. Encouraged to continue. Will follow      Screening for colon cancer    Ordered.       Relevant Orders   Ambulatory referral to Gastroenterology   Screening for breast cancer    Patient will schedule      Relevant Orders   MM SCREENING BREAST TOMO BILATERAL       I am having Kathryn Schneider start on hydrochlorothiazide. I am also having her maintain her amLODipine, benazepril, and meloxicam.   Meds ordered this encounter  Medications  . meloxicam (MOBIC) 15 MG tablet    Sig: TAKE 1 TABLET (15 MG TOTAL) BY MOUTH ONCE DAILY.    Refill:  11  . hydrochlorothiazide (MICROZIDE) 12.5 MG capsule    Sig: Take 1 capsule (12.5 mg total) by mouth daily.    Dispense:  90 capsule    Refill:  0    Order Specific Question:   Supervising Provider    Answer:   Crecencio Mc [[id number]]    Return precautions given.   Risks, benefits, and alternatives of the medications and treatment plan prescribed today were discussed, and patient expressed understanding.   Education regarding symptom management and diagnosis given to patient on AVS.  Continue to follow with Kathryn Hawthorne, FNP for routine health maintenance.   Kathryn Schneider and I agreed with plan.   Mable Paris, FNP

## 2016-11-15 NOTE — Progress Notes (Signed)
Pre visit review using our clinic review tool, if applicable. No additional management support is needed unless otherwise documented below in the visit note. 

## 2016-11-16 ENCOUNTER — Encounter: Payer: Self-pay | Admitting: Family

## 2016-11-16 DIAGNOSIS — Z1211 Encounter for screening for malignant neoplasm of colon: Secondary | ICD-10-CM | POA: Insufficient documentation

## 2016-11-16 DIAGNOSIS — Z1239 Encounter for other screening for malignant neoplasm of breast: Secondary | ICD-10-CM | POA: Insufficient documentation

## 2016-11-16 LAB — BASIC METABOLIC PANEL
BUN: 19 mg/dL (ref 6–23)
CALCIUM: 9.5 mg/dL (ref 8.4–10.5)
CO2: 28 mEq/L (ref 19–32)
CREATININE: 0.74 mg/dL (ref 0.40–1.20)
Chloride: 104 mEq/L (ref 96–112)
GFR: 88.46 mL/min (ref >60.00)
GLUCOSE: 117 mg/dL — AB (ref 70–99)
POTASSIUM: 4.3 meq/L (ref 3.5–5.1)
Sodium: 139 mEq/L (ref 135–145)

## 2016-11-16 NOTE — Assessment & Plan Note (Addendum)
Not at goal. Addede hctz due to LE swelling. Pending repeat bmp. Follow up 2 weeks.

## 2016-11-16 NOTE — Assessment & Plan Note (Signed)
Congratulated patient on notable lifestyle changes. Encouraged to continue. Will follow

## 2016-11-16 NOTE — Assessment & Plan Note (Signed)
Patient will schedule

## 2016-11-16 NOTE — Assessment & Plan Note (Signed)
Ordered

## 2016-11-29 ENCOUNTER — Ambulatory Visit (INDEPENDENT_AMBULATORY_CARE_PROVIDER_SITE_OTHER): Payer: BLUE CROSS/BLUE SHIELD | Admitting: Family

## 2016-11-29 ENCOUNTER — Encounter: Payer: Self-pay | Admitting: Family

## 2016-11-29 VITALS — BP 144/98 | HR 102 | Temp 98.7°F | Ht 66.0 in | Wt >= 6400 oz

## 2016-11-29 DIAGNOSIS — I10 Essential (primary) hypertension: Secondary | ICD-10-CM | POA: Diagnosis not present

## 2016-11-29 DIAGNOSIS — R21 Rash and other nonspecific skin eruption: Secondary | ICD-10-CM

## 2016-11-29 LAB — CBC WITH DIFFERENTIAL/PLATELET
BASOS ABS: 0.1 10*3/uL (ref 0.0–0.1)
Basophils Relative: 0.9 % (ref 0.0–3.0)
Eosinophils Absolute: 0.2 10*3/uL (ref 0.0–0.7)
Eosinophils Relative: 2.7 % (ref 0.0–5.0)
HCT: 44.7 % (ref 36.0–46.0)
Hemoglobin: 14.4 g/dL (ref 12.0–15.0)
LYMPHS ABS: 1.5 10*3/uL (ref 0.7–4.0)
Lymphocytes Relative: 21.4 % (ref 12.0–46.0)
MCHC: 32.2 g/dL (ref 30.0–36.0)
MCV: 89.8 fl (ref 78.0–100.0)
MONO ABS: 0.4 10*3/uL (ref 0.1–1.0)
MONOS PCT: 5.4 % (ref 3.0–12.0)
NEUTROS PCT: 69.6 % (ref 43.0–77.0)
Neutro Abs: 5 10*3/uL (ref 1.4–7.7)
PLATELETS: 287 10*3/uL (ref 150.0–400.0)
RBC: 4.98 Mil/uL (ref 3.87–5.11)
RDW: 15.4 % (ref 11.5–15.5)
WBC: 7.1 10*3/uL (ref 4.0–10.5)

## 2016-11-29 LAB — BASIC METABOLIC PANEL
BUN: 16 mg/dL (ref 7–25)
CHLORIDE: 102 mmol/L (ref 98–110)
CO2: 27 mmol/L (ref 20–31)
Calcium: 9.3 mg/dL (ref 8.6–10.2)
Creat: 0.72 mg/dL (ref 0.50–1.10)
Glucose, Bld: 148 mg/dL — ABNORMAL HIGH (ref 65–99)
POTASSIUM: 4.6 mmol/L (ref 3.5–5.3)
SODIUM: 140 mmol/L (ref 135–146)

## 2016-11-29 LAB — SEDIMENTATION RATE: SED RATE: 89 mm/h — AB (ref 0–20)

## 2016-11-29 NOTE — Assessment & Plan Note (Signed)
Etiology nonspecific at this time. Based on family h/o of wegeners and intermittent presentation for years, we jointly agreed consult with dermatology. In the meantime, I have advised close vigilance. Pending labs studies for autoimmune, kidney function

## 2016-11-29 NOTE — Progress Notes (Signed)
Pre visit review using our clinic review tool, if applicable. No additional management support is needed unless otherwise documented below in the visit note. 

## 2016-11-29 NOTE — Patient Instructions (Addendum)
Labs   Please take blood pressure medication as soon as you get home  Be sure to schedule colonoscopy  Referral to dermatology  Follow up in 3 months, sooner if needed

## 2016-11-29 NOTE — Addendum Note (Signed)
Addended by: Arby Barrette on: 11/29/2016 11:43 AM   Modules accepted: Orders

## 2016-11-29 NOTE — Assessment & Plan Note (Signed)
Elevated. Hasn't taken blood pressure medication today. Advised to do so immediately.

## 2016-11-29 NOTE — Progress Notes (Signed)
Subjective:    Patient ID: Kathryn Schneider, female    DOB: 12/12/1965, 51 y.o.   MRN: [id number]  CC: Kathryn Schneider is a 51 y.o. female who presents today for an acute visit.    HPI: CC: painful ulcerated lesions x 2 years, coming and going.  Describes as burning. Struggles with 'picking at them'. No drainage. First presents 'as raised , blister like fluid' in which  Lesions presented again couple of weeks ago on abdomen.  Arthralgias.  Has seen dermatologist in the past with biopsy on left forearm years ago - tried medications such as antifungal and antibiotic for 'contact dermatitis', no improvement.   Concern for 'wegener's' and lupus and father has h/o wegeners vasculitis.  Had 'lupus' labs drawn years ago.  Hasn't taken blood pressure medication today. Denies exertional chest pain or pressure, numbness or tingling radiating to left arm or jaw, palpitations, dizziness, frequent headaches, changes in vision, or shortness of breath.            HISTORY:  Past Medical History:  Diagnosis Date  . Allergy   . Anal fissure   . Arthritis   . Bipolar disorder ([place])    diagnosed 2000  . Depression   . Diabetes ([place])   . Fibromyalgia   . GERD (gastroesophageal reflux disease)   . HTN (hypertension)   . Hyperlipidemia   . IBS (irritable bowel syndrome)   . Kidney stones   . Obese   . Smoker   . UTI (urinary tract infection)    Past Surgical History:  Procedure Laterality Date  . BREAST BIOPSY  2013  . COLONOSCOPY    . UPPER ENDOSCOPY W/ ANTRODUODENAL MANOMETRY     Family History  Problem Relation Age of Onset  . Depression Mother   . Arthritis Mother   . Osteoporosis Mother   . Vasculitis Father        wegners,   . Cataracts Father   . Atrial fibrillation Father   . Diabetes Father   . Heart disease Father   . Arthritis Father   . Heart disease Maternal Grandmother   . Heart disease Paternal Grandmother     Allergies: Patient has no allergy information on  record. Current Outpatient Prescriptions on File Prior to Visit  Medication Sig Dispense Refill  . amLODipine (NORVASC) 5 MG tablet Take 1 tablet (5 mg total) by mouth daily. 90 tablet 0  . benazepril (LOTENSIN) 20 MG tablet Take 1 tablet (20 mg total) by mouth daily. 90 tablet 3  . hydrochlorothiazide (MICROZIDE) 12.5 MG capsule Take 1 capsule (12.5 mg total) by mouth daily. 90 capsule 0  . meloxicam (MOBIC) 15 MG tablet TAKE 1 TABLET (15 MG TOTAL) BY MOUTH ONCE DAILY.  11   No current facility-administered medications on file prior to visit.     Social History  Substance Use Topics  . Smoking status: Current Every Day Smoker  . Smokeless tobacco: Never Used     Comment: 1 ppd x 5 years  . Alcohol use No    Review of Systems  Constitutional: Negative for chills and fever.  Respiratory: Negative for cough.   Cardiovascular: Negative for chest pain and palpitations.  Gastrointestinal: Negative for nausea and vomiting.  Musculoskeletal: Positive for arthralgias.  Skin: Positive for rash.      Objective:    BP (!) 144/98   Pulse (!) 102   Temp 98.7 F (37.1 C) (Oral)   Ht 5\' 6"  (1.676 m)  Wt (!) 415 lb 6.4 oz (188.4 kg)   SpO2 97%   BMI 67.05 kg/m    Physical Exam  Constitutional: She appears well-developed and well-nourished.  Eyes: Conjunctivae are normal.  Cardiovascular: Normal rate, regular rhythm, normal heart sounds and normal pulses.   Pulmonary/Chest: Effort normal and breath sounds normal. She has no wheezes. She has no rhonchi. She has no rales.  Neurological: She is alert.  Skin: Skin is warm and dry. Lesion noted.     3 discrete erythematous, ulcerated lesions with scabs in the center noted left lower abdomen. Nontender. No discharge, streaking. No increased warmth.   Psychiatric: She has a normal mood and affect. Her speech is normal and behavior is normal. Thought content normal.  Vitals reviewed.      Assessment & Plan:   Problem List Items  Addressed This Visit      Cardiovascular and Mediastinum   HTN (hypertension)    Elevated. Hasn't taken blood pressure medication today. Advised to do so immediately.       Relevant Orders   Comprehensive metabolic panel     Musculoskeletal and Integument   Rash - Primary    Etiology nonspecific at this time. Based on family h/o of wegeners and intermittent presentation for years, we jointly agreed consult with dermatology. In the meantime, I have advised close vigilance. Pending labs studies for autoimmune, kidney function      Relevant Orders   Ambulatory referral to Dermatology   Rheumatoid factor   Sedimentation rate (Completed)   CBC with Differential/Platelet (Completed)   Antinuclear Antib (ANA)        I am having Kathryn Schneider maintain her amLODipine, benazepril, meloxicam, and hydrochlorothiazide.   No orders of the defined types were placed in this encounter.   Return precautions given.   Risks, benefits, and alternatives of the medications and treatment plan prescribed today were discussed, and patient expressed understanding.   Education regarding symptom management and diagnosis given to patient on AVS.  Continue to follow with Kathryn Hawthorne, FNP for routine health maintenance.   Kathryn Schneider and I agreed with plan.   Mable Paris, FNP

## 2016-11-30 ENCOUNTER — Telehealth: Payer: Self-pay

## 2016-11-30 ENCOUNTER — Telehealth: Payer: Self-pay | Admitting: *Deleted

## 2016-11-30 ENCOUNTER — Other Ambulatory Visit: Payer: Self-pay

## 2016-11-30 DIAGNOSIS — Z1211 Encounter for screening for malignant neoplasm of colon: Secondary | ICD-10-CM

## 2016-11-30 DIAGNOSIS — K602 Anal fissure, unspecified: Secondary | ICD-10-CM

## 2016-11-30 LAB — RHEUMATOID FACTOR

## 2016-11-30 LAB — ANA: Anti Nuclear Antibody(ANA): NEGATIVE

## 2016-11-30 NOTE — Telephone Encounter (Signed)
Gastroenterology Pre-Procedure Review  Request Date: 12/29/16 Requesting Physician: Dr. Vicente Males  PATIENT REVIEW QUESTIONS: The patient responded to the following health history questions as indicated:    1. Are you having any GI issues? yes (IBS, Anal Fissures, Left Quadrant Pain) 2. Do you have a personal history of Polyps? no 3. Do you have a family history of Colon Cancer or Polyps? yes (Grandmother Polyps, Great grand father Colon Cancer) 4. Diabetes Mellitus? no 5. Joint replacements in the past 12 months?no 6. Major health problems in the past 3 months?no 7. Any artificial heart valves, MVP, or defibrillator?no    MEDICATIONS & ALLERGIES:    Patient reports the following regarding taking any anticoagulation/antiplatelet therapy:   Plavix, Coumadin, Eliquis, Xarelto, Lovenox, Pradaxa, Brilinta, or Effient? no Aspirin? no  Patient confirms/reports the following medications:  Current Outpatient Prescriptions  Medication Sig Dispense Refill  . amLODipine (NORVASC) 5 MG tablet Take 1 tablet (5 mg total) by mouth daily. 90 tablet 0  . benazepril (LOTENSIN) 20 MG tablet Take 1 tablet (20 mg total) by mouth daily. 90 tablet 3  . hydrochlorothiazide (MICROZIDE) 12.5 MG capsule Take 1 capsule (12.5 mg total) by mouth daily. 90 capsule 0  . meloxicam (MOBIC) 15 MG tablet TAKE 1 TABLET (15 MG TOTAL) BY MOUTH ONCE DAILY.  11   No current facility-administered medications for this visit.     Patient confirms/reports the following allergies:  Not on File  No orders of the defined types were placed in this encounter.   AUTHORIZATION INFORMATION Primary Insurance: 1D#: Group #:  Secondary Insurance: 1D#: Group #:  SCHEDULE INFORMATION: Date: 12/28/16  Time: Location:[facility]

## 2016-11-30 NOTE — Telephone Encounter (Signed)
Patient wanted to [facility] that she has scheduled her colonoscopy, however she requested to have a added referral to have her upper left quadrant pain and fissures . Patient would also like to be done at  Dr. Carmela Rima office.  Pt contact [phone]

## 2016-12-01 NOTE — Telephone Encounter (Signed)
Please advise 

## 2016-12-02 ENCOUNTER — Telehealth: Payer: Self-pay | Admitting: Family

## 2016-12-02 NOTE — Telephone Encounter (Signed)
Left voice mail to call back 

## 2016-12-02 NOTE — Telephone Encounter (Signed)
[name] you may want to call this patient back in regards to labs.   I went over her labs this am.

## 2016-12-02 NOTE — Telephone Encounter (Signed)
Patient would like you to call her she has more questions regarding her lab results.

## 2016-12-02 NOTE — Telephone Encounter (Signed)
Pt called back returning your call. Please advise, thank you!  Call pt @ [phone]

## 2016-12-03 NOTE — Telephone Encounter (Signed)
Patient was instructed to call and get a referral for Left Quadrant pain from GI. Please advise.

## 2016-12-06 NOTE — Telephone Encounter (Signed)
Informed patient, she said she will comply with anything you say.  The abdominal pain is actually on the left side.  Patient stated she is doing well and feels better while being on diet.

## 2016-12-06 NOTE — Telephone Encounter (Signed)
If pain not severe, worsening ( sounds like improving which I am happy to hear)-- advise her to wait on GI appointment We wont order tests at this time.   If pain changes, have her make an appt with [place].

## 2016-12-06 NOTE — Telephone Encounter (Signed)
Left message for patient to return call back.  

## 2016-12-06 NOTE — Telephone Encounter (Signed)
Call pt  Please let her know that referral for both colonoscopy AND for RUQ pain, fissures has been placed.  While we await appointment, would she like to have RUQ [place]? I can go ahead and place if her pain is severe.   Her liver labs in April were normal as well. Would want to redraw if she is having pain, particularly RUQ pain with meals. ( You can get more detail for me brock).

## 2016-12-07 ENCOUNTER — Encounter: Payer: BLUE CROSS/BLUE SHIELD | Admitting: Family

## 2016-12-07 NOTE — Telephone Encounter (Signed)
Left detailed message on her VM.  If she has any questions she can return call back.

## 2016-12-14 ENCOUNTER — Telehealth: Payer: Self-pay | Admitting: Family

## 2016-12-14 DIAGNOSIS — H539 Unspecified visual disturbance: Secondary | ICD-10-CM

## 2016-12-14 NOTE — Telephone Encounter (Signed)
Pt called stating she needs to speak to you regarding her doctor visit yesterday. Please advise?  Call pt @ [phone]. Thank you!

## 2016-12-14 NOTE — Telephone Encounter (Signed)
Left message for patient to return call back.  

## 2016-12-16 NOTE — Telephone Encounter (Signed)
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 They believes some of her SX are related . They did a biopsy. Patient stated she hasn't received results.  Patient wanted to [facility].  Patient also wants to know if she can get referral to Opthalmology.

## 2016-12-20 NOTE — Telephone Encounter (Signed)
Noted  Referral placed.

## 2016-12-21 ENCOUNTER — Encounter: Payer: BLUE CROSS/BLUE SHIELD | Admitting: Family

## 2016-12-23 ENCOUNTER — Telehealth: Payer: Self-pay | Admitting: *Deleted

## 2016-12-23 NOTE — Telephone Encounter (Signed)
FYI Pt missed her eye appt. today, her voicemail was incorrect. Pt was rescheduled . Also   Pt was advised to have her biopsy again. Pt picked at her lesions , with changed the form  for diagnoses .

## 2016-12-24 NOTE — Telephone Encounter (Signed)
FYI

## 2016-12-27 ENCOUNTER — Telehealth: Payer: Self-pay | Admitting: Family

## 2016-12-27 ENCOUNTER — Telehealth: Payer: Self-pay | Admitting: Gastroenterology

## 2016-12-27 NOTE — Telephone Encounter (Signed)
FYI

## 2016-12-27 NOTE — Telephone Encounter (Signed)
Noted, thank you

## 2016-12-27 NOTE — Telephone Encounter (Signed)
Pt had to cancel colonoscopy due to family emergency

## 2016-12-27 NOTE — Telephone Encounter (Signed)
noted 

## 2016-12-27 NOTE — Telephone Encounter (Signed)
Due to a family emergency Kathryn Schneider needs to r/s procedure for tomorrow.

## 2016-12-28 ENCOUNTER — Encounter: Admission: RE | Payer: Self-pay | Source: Ambulatory Visit

## 2016-12-28 ENCOUNTER — Ambulatory Visit
Admission: RE | Admit: 2016-12-28 | Payer: BLUE CROSS/BLUE SHIELD | Source: Ambulatory Visit | Admitting: Gastroenterology

## 2016-12-28 SURGERY — COLONOSCOPY WITH PROPOFOL
Anesthesia: General

## 2016-12-28 NOTE — Telephone Encounter (Signed)
LVM for pt to contact office to reschedule colonoscopy. 

## 2017-01-10 ENCOUNTER — Telehealth: Payer: Self-pay | Admitting: *Deleted

## 2017-01-10 NOTE — Telephone Encounter (Signed)
Patient stated that she;s out of town and has a Uti? Pt is using [facility] , with no results. Pt has requested to have a script called into the local pharmacy and have her parents bring this to her vacation location  Pharmacy [facility]  Pt contact [phone]

## 2017-01-11 NOTE — Telephone Encounter (Signed)
Please advise 

## 2017-01-11 NOTE — Telephone Encounter (Signed)
Patient is aware and will go to the local urgent care to be treated.

## 2017-01-11 NOTE — Telephone Encounter (Signed)
Call pt -   She needs to be seen to determine uti. She may need to go to an urgent care where she is.   Otherwise, she can make an appt with me or colleague at [facility] If symptoms are not worsening and she can wait

## 2017-01-18 ENCOUNTER — Ambulatory Visit: Payer: BLUE CROSS/BLUE SHIELD | Admitting: Family

## 2017-01-18 DIAGNOSIS — Z0289 Encounter for other administrative examinations: Secondary | ICD-10-CM

## 2017-01-25 ENCOUNTER — Other Ambulatory Visit: Payer: Self-pay | Admitting: Family

## 2017-01-25 DIAGNOSIS — I10 Essential (primary) hypertension: Secondary | ICD-10-CM

## 2017-01-26 ENCOUNTER — Telehealth: Payer: Self-pay | Admitting: *Deleted

## 2017-01-26 DIAGNOSIS — M1990 Unspecified osteoarthritis, unspecified site: Secondary | ICD-10-CM

## 2017-01-26 NOTE — Telephone Encounter (Signed)
Pt has requested a medication refill for meloxicam  Pharmacy [facility] in [place]

## 2017-01-27 DIAGNOSIS — M1990 Unspecified osteoarthritis, unspecified site: Secondary | ICD-10-CM | POA: Insufficient documentation

## 2017-01-27 MED ORDER — MELOXICAM 15 MG PO TABS: ORAL_TABLET | ORAL | 1 refills | Status: DC

## 2017-01-27 NOTE — Telephone Encounter (Signed)
Refill request for mobic, last seen [id number], last filled [id number].  Please advise.

## 2017-01-27 NOTE — Telephone Encounter (Signed)
done

## 2017-02-21 ENCOUNTER — Other Ambulatory Visit: Payer: Self-pay | Admitting: Family

## 2017-02-21 DIAGNOSIS — I10 Essential (primary) hypertension: Secondary | ICD-10-CM

## 2017-04-28 ENCOUNTER — Other Ambulatory Visit: Payer: Self-pay | Admitting: Family

## 2017-04-28 DIAGNOSIS — I10 Essential (primary) hypertension: Secondary | ICD-10-CM

## 2017-06-21 ENCOUNTER — Other Ambulatory Visit: Payer: Self-pay | Admitting: Family

## 2017-06-21 ENCOUNTER — Telehealth: Payer: Self-pay

## 2017-06-21 DIAGNOSIS — I10 Essential (primary) hypertension: Secondary | ICD-10-CM

## 2017-06-21 NOTE — Telephone Encounter (Signed)
Copied from [place] [phone]. Topic: Referral - Request >> Jun 21, 2017 10:37 AM Yvette Rack wrote: Reason for CRM: patient would like a referral for a mammogram she has found a lump on her rt breast and she is due for one she would like to go to [facility] in [place] she really don't know the name of the place but it's in [facility]

## 2017-06-21 NOTE — Telephone Encounter (Signed)
Patient has been scheduled with Dr. Terese Door on 07/01/2017.

## 2017-07-01 ENCOUNTER — Ambulatory Visit: Payer: BLUE CROSS/BLUE SHIELD | Admitting: Internal Medicine

## 2017-07-01 DIAGNOSIS — Z0289 Encounter for other administrative examinations: Secondary | ICD-10-CM

## 2017-08-12 ENCOUNTER — Other Ambulatory Visit: Payer: Self-pay | Admitting: Family

## 2017-08-12 DIAGNOSIS — M1990 Unspecified osteoarthritis, unspecified site: Secondary | ICD-10-CM

## 2017-08-12 NOTE — Telephone Encounter (Signed)
Last filled 05/31/17 Last office visit 64/4/18 No office visit scheduled

## 2017-08-15 NOTE — Telephone Encounter (Signed)
Call pt -   She needs f/u appt to continue prescriptions  Refilled mobic for now

## 2017-08-16 ENCOUNTER — Other Ambulatory Visit: Payer: Self-pay | Admitting: Family

## 2017-08-16 DIAGNOSIS — I10 Essential (primary) hypertension: Secondary | ICD-10-CM

## 2017-09-02 NOTE — Telephone Encounter (Signed)
Left voicemail to call schedule appointment

## 2017-09-11 ENCOUNTER — Other Ambulatory Visit: Payer: Self-pay | Admitting: Family

## 2017-09-11 DIAGNOSIS — I10 Essential (primary) hypertension: Secondary | ICD-10-CM

## 2017-09-12 NOTE — Telephone Encounter (Signed)
Pt has cancelled or no showed the last four appt. Okay to refill?

## 2017-09-12 NOTE — Telephone Encounter (Signed)
Call pt  Please make an appt for her to be seen and at that time may give her a one month supply of her BP medications  I cannot recall polocy regarding no shows however she needs to be informed of what it is.

## 2017-09-15 ENCOUNTER — Other Ambulatory Visit: Payer: Self-pay | Admitting: Family

## 2017-09-15 DIAGNOSIS — N6452 Nipple discharge: Secondary | ICD-10-CM

## 2017-09-16 NOTE — Telephone Encounter (Signed)
Left voicemail to call to schedule appointment

## 2017-09-23 ENCOUNTER — Encounter: Payer: Self-pay | Admitting: Family

## 2017-10-03 ENCOUNTER — Ambulatory Visit
Admission: RE | Admit: 2017-10-03 | Discharge: 2017-10-03 | Disposition: A | Discharge status: Home / Self Care | Payer: BLUE CROSS/BLUE SHIELD | Source: Ambulatory Visit | Attending: Family | Admitting: Family

## 2017-10-03 DIAGNOSIS — N6452 Nipple discharge: Secondary | ICD-10-CM

## 2017-10-03 DIAGNOSIS — R928 Other abnormal and inconclusive findings on diagnostic imaging of breast: Secondary | ICD-10-CM | POA: Insufficient documentation

## 2017-10-06 ENCOUNTER — Encounter: Payer: Self-pay | Admitting: *Deleted

## 2017-10-06 NOTE — Telephone Encounter (Signed)
Sent mychart message

## 2017-10-11 NOTE — Telephone Encounter (Signed)
Unread mychart message mailed to patient 

## 2017-11-13 ENCOUNTER — Other Ambulatory Visit: Payer: Self-pay | Admitting: Family

## 2017-11-13 DIAGNOSIS — I10 Essential (primary) hypertension: Secondary | ICD-10-CM

## 2017-11-14 NOTE — Telephone Encounter (Signed)
Per last phone encounter a voicemail was left for patient to schedule an appointment for further refills, and a letter as well as mychart message was sent. Patient does not have an appointment schedule.

## 2017-11-14 NOTE — Telephone Encounter (Signed)
Left voice mail for patient to call back ok for [facility] to speak to patient    

## 2017-11-22 NOTE — Telephone Encounter (Signed)
Left voice mail for patient to call back ok for [facility] to speak to patient    

## 2017-11-27 ENCOUNTER — Other Ambulatory Visit: Payer: Self-pay | Admitting: Family

## 2017-11-27 DIAGNOSIS — I10 Essential (primary) hypertension: Secondary | ICD-10-CM

## 2018-05-10 ENCOUNTER — Other Ambulatory Visit: Payer: Self-pay | Admitting: Family

## 2018-05-10 DIAGNOSIS — M1990 Unspecified osteoarthritis, unspecified site: Secondary | ICD-10-CM

## 2018-08-13 IMAGING — US US BREAST*R* LIMITED INC AXILLA
1 series · 2 of 2 positions shown · non-contrast
Comparison: Previous exam(s).

ACR Breast Density Category a: The breast tissue is almost entirely
fatty.

CLINICAL DATA: Patient reports medial right breast discomfort,
which has since resolved. One episode of right nipple yellowish
discharge 2-3 months ago. No discharge since. No other complaints.

EXAM:
DIGITAL DIAGNOSTIC BILATERAL MAMMOGRAM WITH CAD AND TOMO
ULTRASOUND RIGHT BREAST

[Series 1: us breast*right* limited inc axilla · 0.07mm/px · 2 of 2 slices shown]
[im 1/2]
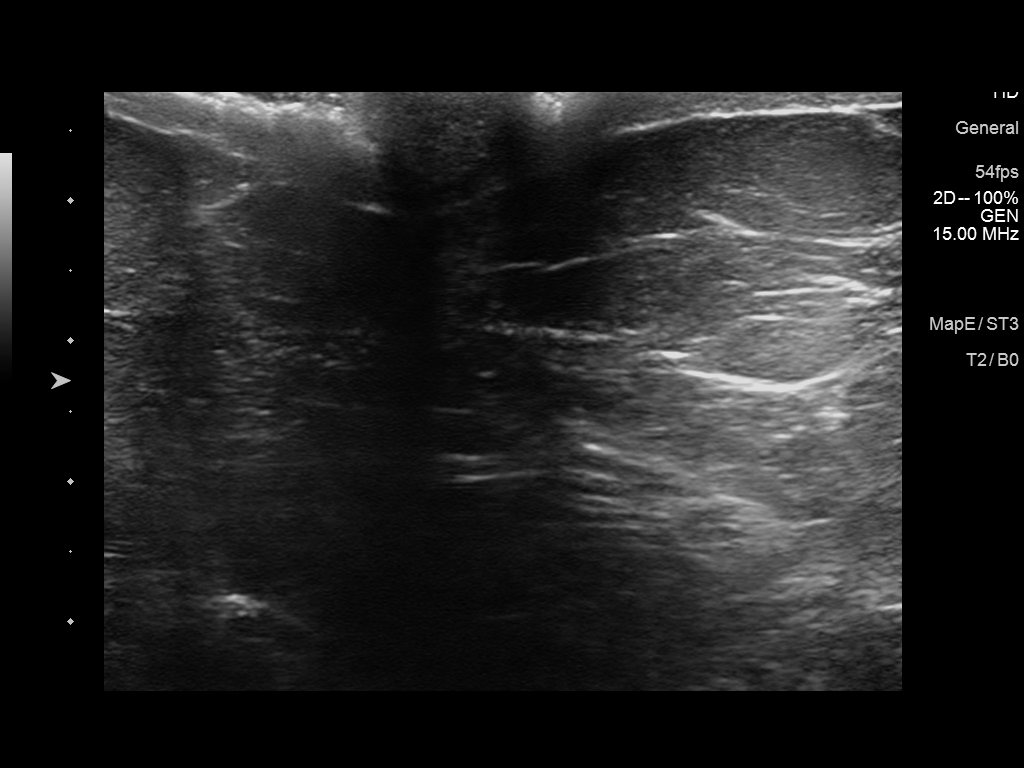
[im 2/2]
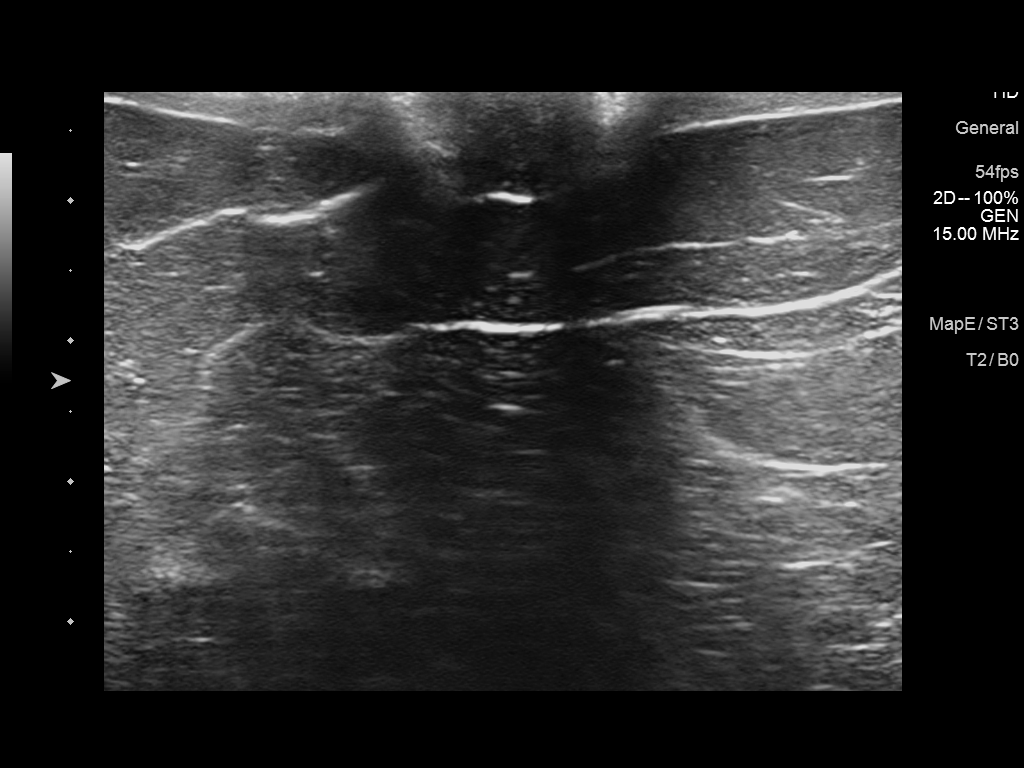

[2 of 2 positions shown; findings below may reference images not displayed]

FINDINGS: There are no discrete masses, areas of architectural distortion,
areas of significant asymmetry or suspicious calcifications.

Mammographic images were processed with CAD.

On physical exam, no retroareolar right breast mass is palpated. No
nipple discharge visualized.

Targeted ultrasound is performed, showing normal fibroglandular
tissue in the retroareolar right breast. No mass. No dilated ducts
or intraductal mass or lesion.
IMPRESSION: Negative exam.  No evidence of breast malignancy.

RECOMMENDATION:
Screening mammogram in one year.(Code:[id number])

I have discussed the findings and recommendations with the patient.
Results were also provided in writing at the conclusion of the
visit. If applicable, a reminder letter will be sent to the patient
regarding the next appointment.

BI-RADS CATEGORY  1: Negative.

## 2021-12-09 ENCOUNTER — Ambulatory Visit (INDEPENDENT_AMBULATORY_CARE_PROVIDER_SITE_OTHER): Payer: BC Managed Care – PPO | Admitting: Dermatology

## 2021-12-09 DIAGNOSIS — L84 Corns and callosities: Secondary | ICD-10-CM

## 2021-12-09 DIAGNOSIS — F424 Excoriation (skin-picking) disorder: Secondary | ICD-10-CM

## 2021-12-09 DIAGNOSIS — L821 Other seborrheic keratosis: Secondary | ICD-10-CM | POA: Diagnosis not present

## 2021-12-09 MED ORDER — TRIAMCINOLONE ACETONIDE 0.1 % EX CREA: 1.0000 | TOPICAL_CREAM | Freq: Every day | CUTANEOUS | 1 refills | Status: DC | PRN

## 2021-12-09 MED ORDER — MUPIROCIN 2 % EX OINT: 1.0000 | TOPICAL_OINTMENT | Freq: Every day | CUTANEOUS | 2 refills | Status: DC

## 2021-12-09 NOTE — Progress Notes (Signed)
   New Patient Visit  Subjective  Kathryn Schneider is a 56 y.o. 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  The patient has spots, moles and lesions to be evaluated, some may be new or changing and the patient has concerns that these could be cancer.   The following portions of the chart were reviewed this encounter and updated as appropriate:   Tobacco  Allergies  Meds  Problems  Med Hx  Surg Hx  Fam Hx      Review of Systems:  No other skin or systemic complaints except as noted in HPI or Assessment and Plan.  Objective  Well appearing patient in no apparent distress; mood and affect are within normal limits.  A focused examination was performed including arms, abdomen, legs. Relevant physical exam findings are noted in the Assessment and Plan.  R hand Calluses on R hand  abdomen, arms Open sores arms, abdomen    Assessment & Plan   Seborrheic Keratoses - Stuck-on, waxy, tan-brown papules and/or plaques  - Benign-appearing - Discussed benign etiology and prognosis. - Observe - Call for any changes - avoid picking, recommend electric razor when shaving around seborrheic keratoses  Callus R hand  Avoid picking  Start Mupirocin ointment qd to open areas Start TMC 0.1% cr bid up to 2 weeks, avoid face, groin, axilla   Topical steroids (such as triamcinolone, fluocinolone, fluocinonide, mometasone, clobetasol, halobetasol, betamethasone, hydrocortisone) can cause thinning and lightening of the skin if they are used for too long in the same area. Your physician has selected the right strength medicine for your problem and area affected on the body. Please use your medication only as  directed by your physician to prevent side effects.    triamcinolone cream (KENALOG) 0.1 % - R hand Apply 1 Application topically daily as needed. Qd to bid aa callus on hands up to 2 weeks, avoid face, groin, axilla  Skin picking habit abdomen, arms  Wash with soap and water, then apply mupirocin oint and cover with bandaid Recommend N-acetylcysteine (NAC) 600 mg supplement three times per day to help with picking  Recommend Cln wash     mupirocin ointment (BACTROBAN) 2 % - abdomen, arms Apply 1 Application topically daily. Qd to open sores on arms, hands, and abdomen   Return in about 1 month (around 01/08/2022) for skin picking, callus f/u.  I, SOthelia Pulling RMA, am acting as scribe for VForest Gleason MD .   Documentation: I have reviewed the above documentation for accuracy and completeness, and I agree with the above.  VForest Gleason MD

## 2021-12-09 NOTE — Patient Instructions (Addendum)
Seborrheic Keratosis  What causes seborrheic keratoses? Seborrheic keratoses are harmless, common skin growths that first appear during adult life.  As time goes by, more growths appear.  Some people may develop a large number of them.  Seborrheic keratoses appear on both covered and uncovered body parts.  They are not caused by sunlight.  The tendency to develop seborrheic keratoses can be inherited.  They vary in color from skin-colored to gray, brown, or even black.  They can be either smooth or have a rough, warty surface.   Seborrheic keratoses are superficial and look as if they were stuck on the skin.  Under the microscope this type of keratosis looks like layers upon layers of skin.  That is why at times the top layer may seem to fall off, but the rest of the growth remains and re-grows.    Treatment Seborrheic keratoses do not need to be treated, but can easily be removed in the office.  Seborrheic keratoses often cause symptoms when they rub on clothing or jewelry.  Lesions can be in the way of shaving.  If they become inflamed, they can cause itching, soreness, or burning.  Removal of a seborrheic keratosis can be accomplished by freezing, burning, or surgery. If any spot bleeds, scabs, or grows rapidly, please return to have it checked, as these can be an indication of a skin cancer.  Recommend N-acetylcysteine (NAC) 600 mg supplement three times per day to help with picking   Recommend Cln wash  Mupirocin ointment daily to open sores Wash open sores with CLN body wash   Due to recent changes in healthcare laws, you may see results of your pathology and/or laboratory studies on MyChart before the doctors have had a chance to review them. We understand that in some cases there may be results that are confusing or concerning to you. Please understand that not all results are received at the same time and often the doctors may need to interpret multiple results in order to provide you with  the best plan of care or course of treatment. Therefore, we ask that you please give [place] 2 business days to thoroughly review all your results before contacting the office for clarification. Should we see a critical lab result, you will be contacted sooner.   If You Need Anything After Your Visit  If you have any questions or concerns for your doctor, please call our main line at [phone] and press option 4 to reach your doctor's medical assistant. If no one answers, please leave a voicemail as directed and we will return your call as soon as possible. Messages left after 4 pm will be answered the following business day.   You may also send [place] a message via [place]. We typically respond to MyChart messages within 1-2 business days.  For prescription refills, please ask your pharmacy to contact our office. Our fax number is [phone].  If you have an urgent issue when the clinic is closed that cannot wait until the next business day, you can page your doctor at the number below.    Please note that while we do our best to be available for urgent issues outside of office hours, we are not available 24/7.   If you have an urgent issue and are unable to reach [place], you may choose to seek medical care at your doctor's office, retail clinic, urgent care center, or emergency room.  If you have a medical emergency, please immediately call 911 or go  to the emergency department.  Pager Numbers  - Dr. Nehemiah Massed: [phone]  - Dr. Laurence Ferrari: [phone]  - Dr. Nicole Kindred: [phone]  In the event of inclement weather, please call our main line at [phone] for an update on the status of any delays or closures.  Dermatology Medication Tips: Please keep the boxes that topical medications come in in order to help keep track of the instructions about where and how to use these. Pharmacies typically print the medication instructions only on the boxes and not directly on the medication tubes.   If  your medication is too expensive, please contact our office at [phone] option 4 or send [place] a message through [place].   We are unable to tell what your co-pay for medications will be in advance as this is different depending on your insurance coverage. However, we may be able to find a substitute medication at lower cost or fill out paperwork to get insurance to cover a needed medication.   If a prior authorization is required to get your medication covered by your insurance company, please allow [place] 1-2 business days to complete this process.  Drug prices often vary depending on where the prescription is filled and some pharmacies may offer cheaper prices.  The website www.goodrx.com contains coupons for medications through different pharmacies. The prices here do not account for what the cost may be with help from insurance (it may be cheaper with your insurance), but the website can give you the price if you did not use any insurance.  - You can print the associated coupon and take it with your prescription to the pharmacy.  - You may also stop by our office during regular business hours and pick up a GoodRx coupon card.  - If you need your prescription sent electronically to a different pharmacy, notify our office through [facility] or by phone at [phone] option 4.     Si Usted Necesita Algo Despus de Su Visita  Tambin puede enviarnos un mensaje a travs de Pharmacist, community. Por lo general respondemos a los mensajes de MyChart en el transcurso de 1 a 2 das hbiles.  Para renovar recetas, por favor pida a su farmacia que se ponga en contacto con nuestra oficina. Harland Dingwall de fax es [place] [phone].  Si tiene un asunto urgente cuando la clnica est cerrada y que no puede esperar hasta el siguiente da hbil, puede llamar/localizar a su doctor(a) al nmero que aparece a continuacin.   Por favor, tenga en cuenta que aunque hacemos todo lo posible para estar disponibles para  asuntos urgentes fuera del horario de [place], no estamos disponibles las 24 horas del da, los 7 das de la [place].   Si tiene un problema urgente y no puede comunicarse con nosotros, puede optar por buscar atencin mdica  en el consultorio de su doctor(a), en una clnica privada, en un centro de atencin urgente o en una sala de emergencias.  Si tiene Engineering geologist, por favor llame inmediatamente al 911 o vaya a la sala de emergencias.  Nmeros de bper  - Dr. Nehemiah Massed: [phone]  - Dra. Moye: [phone]  - Dra. Nicole Kindred: [phone]  En caso de inclemencias del [place], por favor llame a Johnsie Kindred principal al [phone] para una actualizacin sobre el [place] de cualquier retraso o cierre.  Consejos para la medicacin en dermatologa: Por favor, guarde las cajas en las que vienen los medicamentos de uso tpico para ayudarle a seguir las instrucciones sobre dnde y cmo  usarlos. Las farmacias generalmente imprimen las instrucciones del medicamento slo en las cajas y no directamente en los tubos del [place].   Si su medicamento es muy caro, por favor, pngase en contacto con Zigmund Daniel llamando al [phone] y presione la opcin 4 o envenos un mensaje a travs de Pharmacist, community.   No podemos decirle cul ser su copago por los medicamentos por adelantado ya que esto es diferente dependiendo de la cobertura de su seguro. Sin embargo, es posible que podamos encontrar un medicamento sustituto a Electrical engineer un formulario para que el seguro cubra el medicamento que se considera necesario.   Si se requiere una autorizacin previa para que su compaa de seguros [place] su medicamento, por favor permtanos de 1 a 2 das hbiles para completar este proceso.  Los precios de los medicamentos varan con frecuencia dependiendo del Environmental consultant de dnde se surte la receta y alguna farmacias pueden ofrecer precios ms baratos.  El sitio web www.goodrx.com tiene cupones para  medicamentos de Airline pilot. Los precios aqu no tienen en cuenta lo que podra costar con la ayuda del seguro (puede ser ms barato con su seguro), pero el sitio web puede darle el precio si no utiliz Research scientist (physical sciences).  - Puede imprimir el cupn correspondiente y llevarlo con su receta a la farmacia.  - Tambin puede pasar por nuestra oficina durante el horario de atencin regular y Charity fundraiser una tarjeta de cupones de GoodRx.  - Si necesita que su receta se enve electrnicamente a una farmacia diferente, informe a nuestra oficina a travs de MyChart de [place] o por telfono llamando al [phone] y presione la opcin 4.

## 2021-12-15 ENCOUNTER — Encounter: Payer: Self-pay | Admitting: Dermatology

## 2022-01-05 ENCOUNTER — Ambulatory Visit: Payer: PRIVATE HEALTH INSURANCE | Admitting: Dermatology

## 2022-01-12 ENCOUNTER — Ambulatory Visit: Payer: PRIVATE HEALTH INSURANCE | Admitting: Dermatology

## 2022-04-17 ENCOUNTER — Other Ambulatory Visit: Payer: Self-pay | Admitting: Specialist

## 2022-04-17 NOTE — H&P (Signed)
PREOPERATIVE H&P  Chief Complaint: G56.01 Carpal tunnel syndrome, right upper limb  HPI: Kathryn Schneider is a 56 y.o. female who presents for preoperative history and physical with a diagnosis of G56.01 Carpal tunnel syndrome, right upper limb.  The patient has failed conservative treatment.  Nerve conduction studies were positive.  Symptoms are rated as moderate to severe, and have been worsening.  This is significantly impairing activities of daily living.  She has elected for surgical management.   Past Medical History:  Diagnosis Date   Allergy    Anal fissure    Arthritis    Bipolar disorder ([place])    diagnosed 2000   Depression    Diabetes ([place])    Fibromyalgia    GERD (gastroesophageal reflux disease)    HTN (hypertension)    Hyperlipidemia    IBS (irritable bowel syndrome)    Kidney stones    Obese    Smoker    UTI (urinary tract infection)    Past Surgical History:  Procedure Laterality Date   BREAST BIOPSY Right 2013   neg   COLONOSCOPY     UPPER ENDOSCOPY W/ ANTRODUODENAL MANOMETRY     Social History   Socioeconomic History   Marital status: Married    Spouse name: Not on file   Number of children: Not on file   Years of education: Not on file   Highest education level: Not on file  Occupational History   Not on file  Tobacco Use   Smoking status: Every Day   Smokeless tobacco: Never   Tobacco comments:    1 ppd x 5 years  Substance and Sexual Activity   Alcohol use: No   Drug use: No   Sexual activity: Not on file  Other Topics Concern   Not on file  Social History Narrative   Not working at this time      Married            Social Determinants of Radio broadcast assistant Strain: Not on file  Food Insecurity: Not on file  Transportation Needs: Not on file  Physical Activity: Not on file  Stress: Not on file  Social Connections: Not on file   Family History  Problem Relation Age of Onset   Depression Mother    Arthritis Mother     Osteoporosis Mother    Vasculitis Father        wegners,    Cataracts Father    Atrial fibrillation Father    Diabetes Father    Heart disease Father    Arthritis Father    Heart disease Maternal Grandmother    Heart disease Paternal Grandmother    No Known Allergies Prior to Admission medications   Medication Sig Start Date End Date Taking? Authorizing Provider  amLODipine (NORVASC) 5 MG tablet TAKE 1 TABLET (5 MG TOTAL) BY MOUTH DAILY. Patient not taking: Reported on 12/09/2021 11/28/17   Burnard Hawthorne, FNP  benazepril (LOTENSIN) 20 MG tablet TAKE 1 TABLET (20 MG TOTAL) BY MOUTH DAILY. Patient not taking: Reported on 12/09/2021 09/23/17   Burnard Hawthorne, FNP  hydrochlorothiazide (MICROZIDE) 12.5 MG capsule TAKE 1 CAPSULE BY MOUTH EVERY DAY Patient not taking: Reported on 12/09/2021 04/28/17   Burnard Hawthorne, FNP  meloxicam (MOBIC) 15 MG tablet TAKE 1 TABLET BY MOUTH EVERY DAY Patient not taking: Reported on 12/09/2021 05/10/18   Burnard Hawthorne, FNP  mupirocin ointment (BACTROBAN) 2 % Apply 1 Application topically daily. Qd to  open sores on arms, hands, and abdomen 12/09/21   Moye, [place], MD  omeprazole (PRILOSEC) 20 MG capsule TAKE 1-2 CAPSULES (20 TO 40 MG) NIGHTLY AS NEEDED. 08/18/20   [provider]  triamcinolone cream (KENALOG) 0.1 % Apply 1 Application topically daily as needed. Qd to bid aa callus on hands up to 2 weeks, avoid face, groin, axilla 12/09/21   Moye, [place], MD     Positive ROS: All other systems have been reviewed and were otherwise negative with the exception of those mentioned in the HPI and as above.  Physical Exam: General: Alert, no acute distress Cardiovascular: No pedal edema. Heart is regular and without murmur.  Respiratory: No cyanosis, no use of accessory musculature. Lungs are clear. GI: No organomegaly, abdomen is soft and non-tender Skin: No lesions in the area of chief complaint Neurologic: Sensation intact  distally Psychiatric: Patient is competent for consent with normal mood and affect Lymphatic: No axillary or cervical lymphadenopathy  MUSCULOSKELETAL: The skin is intact.  Positive median nerve compression test.  Pinch is slightly weak.  Gross sensation is intact.  Assessment: G56.01 Carpal tunnel syndrome, right upper limb  Plan: Plan for Procedure(s): CARPAL TUNNEL RELEASE  The risks benefits and alternatives were discussed with the patient including but not limited to the risks of nonoperative treatment, versus surgical intervention including infection, bleeding, nerve injury,  blood clots, cardiopulmonary complications, morbidity, mortality, among others, and they were willing to proceed.   Park Breed, MD [phone]   04/17/2022 11:36 AM

## 2022-04-20 ENCOUNTER — Inpatient Hospital Stay: Admission: RE | Admit: 2022-04-20 | Payer: PRIVATE HEALTH INSURANCE | Source: Ambulatory Visit

## 2022-04-22 ENCOUNTER — Other Ambulatory Visit: Payer: Self-pay

## 2022-04-22 ENCOUNTER — Encounter
Admission: RE | Admit: 2022-04-22 | Discharge: 2022-04-22 | Disposition: A | Discharge status: Home / Self Care | Payer: PRIVATE HEALTH INSURANCE | Source: Ambulatory Visit | Attending: Specialist | Admitting: Specialist

## 2022-04-22 DIAGNOSIS — I10 Essential (primary) hypertension: Secondary | ICD-10-CM

## 2022-04-22 NOTE — Patient Instructions (Signed)
Your procedure is scheduled on: 04/29/22 Report to [facility]. To find out your arrival time please call [phone] between 1PM - 3PM on 05/08/22.  Remember: Instructions that are not followed completely may result in serious medical risk, up to and including death, or upon the discretion of your surgeon and anesthesiologist your surgery may need to be rescheduled.     _X__ 1. Do not eat food or drink any liquids after midnight the night before your procedure.                 No gum chewing or hard candies.   __X__2.  On the morning of surgery brush your teeth with toothpaste and water, you                 may rinse your mouth with mouthwash if you wish.  Do not swallow any              toothpaste of mouthwash.     _X__ 3.  No Alcohol for 24 hours before or after surgery.   _X__ 4.  Do Not Smoke or use e-cigarettes For 24 Hours Prior to Your Surgery.                 Do not use any chewable tobacco products for at least 6 hours prior to                 surgery.  ____  5.  Bring all medications with you on the day of surgery if instructed.   __X__  6.  Notify your doctor if there is any change in your medical condition      (cold, fever, infections).     Do not wear jewelry, make-up, hairpins, clips or nail polish. Do not wear lotions, powders, or perfumes. No deodorant. Do not shave body hair 48 hours prior to surgery. Men may shave face and neck. Do not bring valuables to the hospital.    [facility] is not responsible for any belongings or valuables.  Contacts, dentures/partials or body piercings may not be worn into surgery. Bring a case for your contacts, glasses or hearing aids, a denture cup will be supplied. Leave your suitcase in the car. After surgery it may be brought to your room. For patients admitted to the hospital, discharge time is determined by your treatment team.   Patients discharged the day of surgery  will not be allowed to drive home.   Please read over the following fact sheets that you were given:   Chg soap  __X__ Take these medicines the morning of surgery with A SIP OF WATER:    1. omeprazole (PRILOSEC) 20 MG capsule   2. We will give you gabapentin before surgery  3.   4.  5.  6.  ____ Fleet Enema (as directed)   __X__ Use CHG Soap/SAGE wipes as directed  ____ Use inhalers on the day of surgery  ____ Stop metformin/Janumet/Farxiga 2 days prior to surgery    ____ Take 1/2 of usual insulin dose the night before surgery. No insulin the morning          of surgery.   ____ Stop Blood Thinners Coumadin/Plavix/Xarelto/Pleta/Pradaxa/Eliquis/Effient/Aspirin  on   Or contact your Surgeon, Cardiologist or Medical Doctor regarding  ability to stop your blood thinners  __X__ Stop Anti-inflammatories 7 days before surgery such as Advil, Ibuprofen, Motrin,  BC or Goodies Powder, Naprosyn, Naproxen, Aleve,  Aspirin   You may use Tylenol if needed  __X__ Stop all herbals and supplements, fish oil or vitamins  until after surgery.    ____ Bring C-Pap to the hospital.

## 2022-04-23 ENCOUNTER — Inpatient Hospital Stay
Admission: RE | Admit: 2022-04-23 | Discharge: 2022-04-23 | Disposition: A | Discharge status: Home / Self Care | Payer: PRIVATE HEALTH INSURANCE | Source: Ambulatory Visit

## 2022-04-23 HISTORY — DX: Cardiac murmur, unspecified: R01.1

## 2022-04-23 HISTORY — DX: Obsessive-compulsive disorder, unspecified: F42.9

## 2022-04-26 ENCOUNTER — Encounter
Admission: RE | Admit: 2022-04-26 | Discharge: 2022-04-26 | Disposition: A | Discharge status: Home / Self Care | Payer: BC Managed Care – PPO | Source: Ambulatory Visit | Attending: Specialist | Admitting: Specialist

## 2022-04-26 DIAGNOSIS — I10 Essential (primary) hypertension: Secondary | ICD-10-CM | POA: Diagnosis not present

## 2022-04-26 DIAGNOSIS — Z01810 Encounter for preprocedural cardiovascular examination: Secondary | ICD-10-CM | POA: Insufficient documentation

## 2022-04-29 ENCOUNTER — Ambulatory Visit
Admission: RE | Admit: 2022-04-29 | Discharge: 2022-04-29 | Disposition: A | Discharge status: Home / Self Care | Payer: BC Managed Care – PPO | Attending: Specialist | Admitting: Specialist

## 2022-04-29 ENCOUNTER — Ambulatory Visit: Payer: BC Managed Care – PPO | Admitting: Anesthesiology

## 2022-04-29 ENCOUNTER — Other Ambulatory Visit: Payer: Self-pay

## 2022-04-29 ENCOUNTER — Encounter: Payer: Self-pay | Admitting: Specialist

## 2022-04-29 ENCOUNTER — Encounter
Admission: RE | Disposition: A | Discharge status: Home / Self Care | Payer: Self-pay | Source: Home / Self Care | Attending: Specialist

## 2022-04-29 DIAGNOSIS — E669 Obesity, unspecified: Secondary | ICD-10-CM | POA: Insufficient documentation

## 2022-04-29 DIAGNOSIS — K219 Gastro-esophageal reflux disease without esophagitis: Secondary | ICD-10-CM | POA: Insufficient documentation

## 2022-04-29 DIAGNOSIS — F17210 Nicotine dependence, cigarettes, uncomplicated: Secondary | ICD-10-CM | POA: Insufficient documentation

## 2022-04-29 DIAGNOSIS — M1990 Unspecified osteoarthritis, unspecified site: Secondary | ICD-10-CM | POA: Diagnosis not present

## 2022-04-29 DIAGNOSIS — E785 Hyperlipidemia, unspecified: Secondary | ICD-10-CM | POA: Insufficient documentation

## 2022-04-29 DIAGNOSIS — M797 Fibromyalgia: Secondary | ICD-10-CM | POA: Diagnosis not present

## 2022-04-29 DIAGNOSIS — F319 Bipolar disorder, unspecified: Secondary | ICD-10-CM | POA: Insufficient documentation

## 2022-04-29 DIAGNOSIS — I10 Essential (primary) hypertension: Secondary | ICD-10-CM | POA: Diagnosis not present

## 2022-04-29 DIAGNOSIS — E119 Type 2 diabetes mellitus without complications: Secondary | ICD-10-CM | POA: Insufficient documentation

## 2022-04-29 DIAGNOSIS — K589 Irritable bowel syndrome without diarrhea: Secondary | ICD-10-CM | POA: Insufficient documentation

## 2022-04-29 DIAGNOSIS — Z6824 Body mass index (BMI) 24.0-24.9, adult: Secondary | ICD-10-CM | POA: Insufficient documentation

## 2022-04-29 DIAGNOSIS — G5601 Carpal tunnel syndrome, right upper limb: Secondary | ICD-10-CM | POA: Insufficient documentation

## 2022-04-29 HISTORY — PX: CARPAL TUNNEL RELEASE: SHX101

## 2022-04-29 SURGERY — CARPAL TUNNEL RELEASE
Anesthesia: General | Site: Hand | Laterality: Right

## 2022-04-29 MED ORDER — CHLORHEXIDINE GLUCONATE 0.12 % MT SOLN: 15.0000 mL | Freq: Once | OROMUCOSAL | Status: AC

## 2022-04-29 MED ORDER — LIDOCAINE HCL (PF) 2 % IJ SOLN
INTRAMUSCULAR | Status: AC
  Filled 2022-04-29: qty 5

## 2022-04-29 MED ORDER — DEXAMETHASONE SODIUM PHOSPHATE 10 MG/ML IJ SOLN
INTRAMUSCULAR | Status: DC | PRN
  Administered 2022-04-29: 10 mg via INTRAVENOUS

## 2022-04-29 MED ORDER — MIDAZOLAM HCL 2 MG/2ML IJ SOLN
INTRAMUSCULAR | Status: AC
  Filled 2022-04-29: qty 2

## 2022-04-29 MED ORDER — BUPIVACAINE HCL 0.5 % IJ SOLN
INTRAMUSCULAR | Status: DC | PRN
  Administered 2022-04-29: 16 mL

## 2022-04-29 MED ORDER — ONDANSETRON HCL 4 MG/2ML IJ SOLN: 4.0000 mg | Freq: Once | INTRAMUSCULAR | Status: DC | PRN

## 2022-04-29 MED ORDER — 0.9 % SODIUM CHLORIDE (POUR BTL) OPTIME
TOPICAL | Status: DC | PRN
  Administered 2022-04-29: 100 mL

## 2022-04-29 MED ORDER — ORAL CARE MOUTH RINSE: 15.0000 mL | Freq: Once | OROMUCOSAL | Status: AC

## 2022-04-29 MED ORDER — GABAPENTIN 400 MG PO CAPS: 400.0000 mg | ORAL_CAPSULE | Freq: Three times a day (TID) | ORAL | 3 refills | Status: AC

## 2022-04-29 MED ORDER — GABAPENTIN 300 MG PO CAPS: 300.0000 mg | ORAL_CAPSULE | ORAL | Status: DC

## 2022-04-29 MED ORDER — ONDANSETRON HCL 4 MG/2ML IJ SOLN
INTRAMUSCULAR | Status: AC
  Filled 2022-04-29: qty 2

## 2022-04-29 MED ORDER — MELOXICAM 7.5 MG PO TABS: 15.0000 mg | ORAL_TABLET | ORAL | Status: DC

## 2022-04-29 MED ORDER — LACTATED RINGERS IV SOLN: INTRAVENOUS | Status: DC

## 2022-04-29 MED ORDER — FENTANYL CITRATE (PF) 100 MCG/2ML IJ SOLN
INTRAMUSCULAR | Status: DC | PRN
  Administered 2022-04-29 (×2): 50 ug via INTRAVENOUS

## 2022-04-29 MED ORDER — LIDOCAINE HCL (CARDIAC) PF 100 MG/5ML IV SOSY
PREFILLED_SYRINGE | INTRAVENOUS | Status: DC | PRN
  Administered 2022-04-29: 100 mg via INTRAVENOUS

## 2022-04-29 MED ORDER — FENTANYL CITRATE (PF) 100 MCG/2ML IJ SOLN: 25.0000 ug | INTRAMUSCULAR | Status: DC | PRN

## 2022-04-29 MED ORDER — BUPIVACAINE HCL (PF) 0.5 % IJ SOLN
INTRAMUSCULAR | Status: AC
  Filled 2022-04-29: qty 30

## 2022-04-29 MED ORDER — DEXAMETHASONE SODIUM PHOSPHATE 10 MG/ML IJ SOLN
INTRAMUSCULAR | Status: AC
  Filled 2022-04-29: qty 1

## 2022-04-29 MED ORDER — FENTANYL CITRATE (PF) 100 MCG/2ML IJ SOLN
INTRAMUSCULAR | Status: AC
  Filled 2022-04-29: qty 2

## 2022-04-29 MED ORDER — PROPOFOL 10 MG/ML IV BOLUS
INTRAVENOUS | Status: DC | PRN
  Administered 2022-04-29: 150 mg via INTRAVENOUS

## 2022-04-29 MED ORDER — CHLORHEXIDINE GLUCONATE CLOTH 2 % EX PADS
6.0000 | MEDICATED_PAD | Freq: Once | CUTANEOUS | Status: AC
  Administered 2022-04-29: 6 via TOPICAL

## 2022-04-29 MED ORDER — PROPOFOL 10 MG/ML IV BOLUS
INTRAVENOUS | Status: AC
  Filled 2022-04-29: qty 40

## 2022-04-29 MED ORDER — MIDAZOLAM HCL 2 MG/2ML IJ SOLN
INTRAMUSCULAR | Status: DC | PRN
  Administered 2022-04-29: 2 mg via INTRAVENOUS

## 2022-04-29 MED ORDER — CEFAZOLIN SODIUM-DEXTROSE 2-4 GM/100ML-% IV SOLN
2.0000 g | INTRAVENOUS | Status: AC
  Administered 2022-04-29: 2 g via INTRAVENOUS

## 2022-04-29 MED ORDER — CEFAZOLIN SODIUM-DEXTROSE 2-4 GM/100ML-% IV SOLN
INTRAVENOUS | Status: AC
  Filled 2022-04-29: qty 100

## 2022-04-29 MED ORDER — HYDROCODONE-ACETAMINOPHEN 5-325 MG PO TABS: 1.0000 | ORAL_TABLET | Freq: Four times a day (QID) | ORAL | 0 refills | Status: DC | PRN

## 2022-04-29 MED ORDER — ONDANSETRON HCL 4 MG/2ML IJ SOLN
INTRAMUSCULAR | Status: DC | PRN
  Administered 2022-04-29: 4 mg via INTRAVENOUS

## 2022-04-29 MED ORDER — GABAPENTIN 300 MG PO CAPS
ORAL_CAPSULE | ORAL | Status: AC
  Filled 2022-04-29: qty 1

## 2022-04-29 MED ORDER — CHLORHEXIDINE GLUCONATE 0.12 % MT SOLN
OROMUCOSAL | Status: AC
  Administered 2022-04-29: 15 mL via OROMUCOSAL
  Filled 2022-04-29: qty 15

## 2022-04-29 SURGICAL SUPPLY — 27 items
APL PRP STRL LF DISP 70% ISPRP (MISCELLANEOUS) ×2
BLADE SURG MINI STRL (BLADE) ×2 IMPLANT
BNDG ESMARK 4X12 TAN STRL LF (GAUZE/BANDAGES/DRESSINGS) ×2 IMPLANT
CHLORAPREP W/TINT 26 (MISCELLANEOUS) ×2 IMPLANT
DRSG GAUZE FLUFF 36X18 (GAUZE/BANDAGES/DRESSINGS) ×4 IMPLANT
ELECT REM PT RETURN 9FT ADLT (ELECTROSURGICAL) ×1
ELECTRODE REM PT RTRN 9FT ADLT (ELECTROSURGICAL) ×2 IMPLANT
GAUZE XEROFORM 1X8 LF (GAUZE/BANDAGES/DRESSINGS) ×2 IMPLANT
GLOVE BIO SURGEON STRL SZ8 (GLOVE) ×2 IMPLANT
GOWN STRL REUS W/ TWL LRG LVL3 (GOWN DISPOSABLE) ×2 IMPLANT
GOWN STRL REUS W/TWL LRG LVL3 (GOWN DISPOSABLE) ×1
GOWN STRL REUS W/TWL LRG LVL4 (GOWN DISPOSABLE) ×2 IMPLANT
KIT TURNOVER KIT A (KITS) ×2 IMPLANT
MANIFOLD NEPTUNE II (INSTRUMENTS) ×2 IMPLANT
NS IRRIG 500ML POUR BTL (IV SOLUTION) ×2 IMPLANT
PACK EXTREMITY ARMC (MISCELLANEOUS) ×2 IMPLANT
PAD PREP 24X41 OB/GYN DISP (PERSONAL CARE ITEMS) ×2 IMPLANT
PADDING CAST BLEND 4X4 STRL (MISCELLANEOUS) ×2 IMPLANT
SPLINT CAST 1 STEP 3X12 (MISCELLANEOUS) ×2 IMPLANT
STOCKINETTE 48X4 2 PLY STRL (GAUZE/BANDAGES/DRESSINGS) ×2 IMPLANT
STOCKINETTE BIAS CUT 4 980044 (GAUZE/BANDAGES/DRESSINGS) ×2 IMPLANT
STOCKINETTE STRL 4IN 9604848 (GAUZE/BANDAGES/DRESSINGS) ×1 IMPLANT
SUT ETHILON 4-0 (SUTURE) ×1
SUT ETHILON 4-0 FS2 18XMFL BLK (SUTURE) ×1
SUT ETHILON 5-0 FS-2 18 BLK (SUTURE) ×2 IMPLANT
SUTURE ETHLN 4-0 FS2 18XMF BLK (SUTURE) ×2 IMPLANT
TRAP FLUID SMOKE EVACUATOR (MISCELLANEOUS) ×2 IMPLANT

## 2022-04-29 NOTE — Anesthesia Postprocedure Evaluation (Signed)
Anesthesia Post Note  Patient: Kathryn Schneider  Procedure(s) Performed: CARPAL TUNNEL RELEASE (Right: Hand)  Patient location during evaluation: PACU Anesthesia Type: General Level of consciousness: awake Pain management: satisfactory to patient Vital Signs Assessment: post-procedure vital signs reviewed and stable Respiratory status: spontaneous breathing and nonlabored ventilation Cardiovascular status: stable Anesthetic complications: no  No notable events documented.   Last Vitals:  Vitals:   04/29/22 0915 04/29/22 0930  BP: (!) 143/76 (!) 155/86  Pulse:  (!) 58  Resp:  18  Temp: (!) 36.2 C (!) 36.1 C  SpO2: 97% 100%    Last Pain:  Vitals:   04/29/22 0930  TempSrc:   PainSc: 0-No pain                 VAN STAVEREN,Derinda Bartus

## 2022-04-29 NOTE — Anesthesia Procedure Notes (Signed)
Procedure Name: LMA Insertion Date/Time: 04/29/2022 7:53 AM  Performed by: Esaw Grandchild, CRNAPre-anesthesia Checklist: Patient identified, Emergency Drugs available, Suction available and Patient being monitored Patient Re-evaluated:Patient Re-evaluated prior to induction Oxygen Delivery Method: Circle system utilized Preoxygenation: Pre-oxygenation with 100% oxygen Induction Type: IV induction LMA: LMA inserted LMA Size: 4.0 Number of attempts: 1 Placement Confirmation: positive ETCO2 and breath sounds checked- equal and bilateral Tube secured with: Tape Dental Injury: Teeth and Oropharynx as per pre-operative assessment  Comments: Igel size 4 utilized w/o difficulty

## 2022-04-29 NOTE — Transfer of Care (Signed)
Immediate Anesthesia Transfer of Care Note  Patient: Kathryn Schneider  Procedure(s) Performed: CARPAL TUNNEL RELEASE (Right: Hand)  Patient Location: PACU  Anesthesia Type:General  Level of Consciousness: drowsy  Airway & Oxygen Therapy: Patient Spontanous Breathing and Patient connected to face mask oxygen  Post-op Assessment: Report given to RN, Post -op Vital signs reviewed and stable, and Patient moving all extremities  Post vital signs: Reviewed and stable  Last Vitals:  Vitals Value Taken Time  BP 143/84 04/29/22 0841  Temp    Pulse 51 04/29/22 0843  Resp 11 04/29/22 0843  SpO2 100 % 04/29/22 0843  Vitals shown include unvalidated device data.  Last Pain:  Vitals:   04/29/22 0655  TempSrc: Temporal  PainSc: 8          Complications: No notable events documented.

## 2022-04-29 NOTE — Anesthesia Preprocedure Evaluation (Signed)
Anesthesia Evaluation  Patient identified by MRN, date of birth, ID band Patient awake    Reviewed: Allergy & Precautions, NPO status , Patient's Chart, lab work & pertinent test results  Airway Mallampati: II  TM Distance: >3 FB Neck ROM: Full    Dental  (+) Teeth Intact   Pulmonary neg pulmonary ROS, Current Smoker and Patient abstained from smoking.   Pulmonary exam normal  + decreased breath sounds      Cardiovascular hypertension, negative cardio ROS Normal cardiovascular exam Rhythm:Regular Rate:Normal     Neuro/Psych   Anxiety Depression    negative neurological ROS  negative psych ROS   GI/Hepatic negative GI ROS, Neg liver ROS,,,  Endo/Other  negative endocrine ROSdiabetes    Renal/GU      Musculoskeletal   Abdominal Normal abdominal exam  (+)   Peds  Hematology negative hematology ROS (+)   Anesthesia Other Findings Past Medical History: No date: Allergy No date: Anal fissure No date: Arthritis No date: Bipolar disorder [facility])     Comment:  diagnosed 2000 No date: Depression No date: Diabetes ([place]) No date: Fibromyalgia No date: GERD (gastroesophageal reflux disease) No date: Heart murmur No date: HTN (hypertension) No date: Hyperlipidemia No date: IBS (irritable bowel syndrome) No date: Kidney stones No date: Obese No date: OCD (obsessive compulsive disorder) No date: Smoker No date: UTI (urinary tract infection)  Past Surgical History: 2013: BREAST BIOPSY; Right     Comment:  neg No date: COLONOSCOPY No date: FRACTURE SURGERY No date: GASTROPLASTY DUODENAL SWITCH No date: TYMPANOPLASTY No date: UPPER ENDOSCOPY W/ ANTRODUODENAL MANOMETRY  BMI    Body Mass Index: 24.28 kg/m      Reproductive/Obstetrics negative OB ROS                             Anesthesia Physical Anesthesia Plan  ASA: 2  Anesthesia Plan: General   Post-op Pain Management:     Induction: Intravenous  PONV Risk Score and Plan: 1 and Ondansetron and Dexamethasone  Airway Management Planned: LMA  Additional Equipment:   Intra-op Plan:   Post-operative Plan: Extubation in OR  Informed Consent: I have reviewed the patients History and Physical, chart, labs and discussed the procedure including the risks, benefits and alternatives for the proposed anesthesia with the patient or authorized representative who has indicated his/her understanding and acceptance.     Dental Advisory Given  Plan Discussed with: CRNA and Surgeon  Anesthesia Plan Comments:        Anesthesia Quick Evaluation

## 2022-04-29 NOTE — Discharge Instructions (Signed)

## 2022-04-29 NOTE — Op Note (Signed)
04/29/2022  8:38 AM  PATIENT:  Kathryn Schneider    PRE-OPERATIVE DIAGNOSIS: RIGHT CARPAL TUNNEL SYNDROME POST-OPERATIVE DIAGNOSIS: RIGHT CARPAL TUNNEL SYNDROME  PROCEDURE:  RIGHT CARPAL TUNNEL RELEASE  SURGEON: Park Breed, MD    ANESTHESIA:   General  TOURNIQUET TIME: 16   MIN  PREOPERATIVE INDICATIONS:  Kathryn Schneider is a  56 y.o. female with a diagnosis of right carpal tunnel syndrome who failed conservative measures and elected for surgical management.    The risks benefits and alternatives were discussed with the patient preoperatively including but not limited to the risks of infection, bleeding, nerve injury, incomplete relief of symptoms, pillar pain, cardiopulmonary complications, the need for revision surgery, among others, and the patient was willing to proceed.  OPERATIVE FINDINGS: Thickened volar ligament and nerve compression.  OPERATIVE PROCEDURE: The patient is brought to the operating room placed in the supine position. General anesthesia was administered. The right upper extremity was prepped and draped in usual sterile fashion. Time out was performed. The arm was elevated and exsanguinated and the tourniquet was inflated. Incision was made in line with the radial border of the ring finger. The carpal tunnel transverse fascia was identified, cleaned, and incised sharply. The common sensory branches were visualized along with the superficial palmar arch and protected.  The median nerve was protected below  A Kelly clamp was  placed underneath the transverse carpal ligament, protecting the nerve. I released the ligament completely, and then released the proximal distal volar forearm fascia. The nerve was identified, and visualized, and protected throughout the case. The motor branch was intact upon inspection.  No masses or abnormalities were identified in ulnar bursa.  The wounds were irrigated copiously, and the skin closed with nylon. The wound was injected with 1/2%  marcaine followed by a sterile dressing and  volar splint .  Tourniquet was deflated with good return of blood flow to all fingers. Sponge and needle counts were correct.  The patient tolerated this well, with no complications. The patient was awakened and taken to recovery in good condition.

## 2022-04-29 NOTE — H&P (Signed)
THE PATIENT WAS SEEN PRIOR TO SURGERY TODAY.  HISTORY, ALLERGIES, HOME MEDICATIONS AND OPERATIVE PROCEDURE WERE REVIEWED. RISKS AND BENEFITS OF SURGERY DISCUSSED WITH PATIENT AGAIN.  NO CHANGES FROM INITIAL HISTORY AND PHYSICAL NOTED.    

## 2022-05-08 ENCOUNTER — Other Ambulatory Visit: Payer: Self-pay | Admitting: Specialist

## 2022-05-08 NOTE — H&P (Signed)
PREOPERATIVE H&P  Chief Complaint: G56.02 Carpal tunnel syndrome, left upper limb  HPI: Kathryn Schneider is a 56 y.o. female who presents for preoperative history and physical with a diagnosis of G56.02 Carpal tunnel syndrome, left upper limb. Symptoms are rated as moderate to severe, and have been worsening.  She has failed conservative treatment with injections, splinting, and medication.  She has had a satisfactory right carpal tunnel release done 2 weeks ago.  This is significantly impairing activities of daily living.  She has elected for surgical management.   Past Medical History:  Diagnosis Date   Allergy    Anal fissure    Arthritis    Bipolar disorder ([place])    diagnosed 2000   Depression    Diabetes ([place])    Fibromyalgia    GERD (gastroesophageal reflux disease)    Heart murmur    HTN (hypertension)    Hyperlipidemia    IBS (irritable bowel syndrome)    Kidney stones    Obese    OCD (obsessive compulsive disorder)    Smoker    UTI (urinary tract infection)    Past Surgical History:  Procedure Laterality Date   BREAST BIOPSY Right 2013   neg   CARPAL TUNNEL RELEASE Right 04/29/2022   Procedure: CARPAL TUNNEL RELEASE;  Surgeon: Earnestine Leys, MD;  Location: [facility] ORS;  Service: Orthopedics;  Laterality: Right;   COLONOSCOPY     FRACTURE SURGERY     GASTROPLASTY DUODENAL SWITCH     TYMPANOPLASTY     UPPER ENDOSCOPY W/ ANTRODUODENAL MANOMETRY     Social History   Socioeconomic History   Marital status: Married    Spouse name: Not on file   Number of children: Not on file   Years of education: Not on file   Highest education level: Not on file  Occupational History   Not on file  Tobacco Use   Smoking status: Every Day    Packs/day: 0.50    Types: Cigarettes   Smokeless tobacco: Never   Tobacco comments:    1 ppd x 5 years  Vaping Use   Vaping Use: Never used  Substance and Sexual Activity   Alcohol use: No    Comment: occ   Drug use: No   Sexual  activity: Not on file  Other Topics Concern   Not on file  Social History Narrative   Not working at this time      Married            Social Determinants of Radio broadcast assistant Strain: Not on file  Food Insecurity: Not on file  Transportation Needs: Not on file  Physical Activity: Not on file  Stress: Not on file  Social Connections: Not on file   Family History  Problem Relation Age of Onset   Depression Mother    Arthritis Mother    Osteoporosis Mother    Vasculitis Father        wegners,    Cataracts Father    Atrial fibrillation Father    Diabetes Father    Heart disease Father    Arthritis Father    Heart disease Maternal Grandmother    Heart disease Paternal Grandmother    Allergies  Allergen Reactions   Bee Venom Swelling   Ivp Dye [Iodinated Contrast Media] Itching   Shellfish Allergy Swelling   Nsaids Other (See Comments)    bleeding   Latex Itching   Prior to Admission medications   Medication Sig  Start Date End Date Taking? Authorizing Provider  acetaminophen (TYLENOL) 500 MG tablet Take 1,000 mg by mouth every 6 (six) hours as needed for moderate pain.    [provider]  amLODipine (NORVASC) 5 MG tablet TAKE 1 TABLET (5 MG TOTAL) BY MOUTH DAILY. Patient not taking: Reported on 12/09/2021 11/28/17   Burnard Hawthorne, FNP  benazepril (LOTENSIN) 20 MG tablet TAKE 1 TABLET (20 MG TOTAL) BY MOUTH DAILY. Patient not taking: Reported on 12/09/2021 09/23/17   Burnard Hawthorne, FNP  FOLIC ACID PO Take 341 mcg by mouth daily.    [provider]  gabapentin (NEURONTIN) 300 MG capsule Take 300 mg by mouth 3 (three) times daily.    [provider]  gabapentin (NEURONTIN) 400 MG capsule Take 1 capsule (400 mg total) by mouth 3 (three) times daily. 04/29/22   Earnestine Leys, MD  hydrochlorothiazide (MICROZIDE) 12.5 MG capsule TAKE 1 CAPSULE BY MOUTH EVERY DAY Patient not taking: Reported on 12/09/2021 04/28/17   Burnard Hawthorne, FNP  HYDROcodone-acetaminophen (NORCO) 5-325 MG tablet Take 1-2 tablets by mouth every 6 (six) hours as needed. 04/29/22   Earnestine Leys, MD  Magnesium 250 MG TABS Take 1 tablet by mouth daily.    [provider]  meloxicam (MOBIC) 15 MG tablet TAKE 1 TABLET BY MOUTH EVERY DAY Patient not taking: Reported on 12/09/2021 05/10/18   Burnard Hawthorne, FNP  omeprazole (PRILOSEC) 20 MG capsule 20 mg at bedtime. 08/18/20   [provider]     Positive ROS: All other systems have been reviewed and were otherwise negative with the exception of those mentioned in the HPI and as above.  Physical Exam: General: Alert, no acute distress Cardiovascular: No pedal edema. Heart is regular and without murmur.  Respiratory: No cyanosis, no use of accessory musculature. Lungs are clear. GI: No organomegaly, abdomen is soft and non-tender Skin: No lesions in the area of chief complaint Neurologic: Sensation intact distally Psychiatric: Patient is competent for consent with normal mood and affect Lymphatic: No axillary or cervical lymphadenopathy  MUSCULOSKELETAL: Positive median nerve compression test on the left.  Pinch is weak.  Gross sensations intact.  The skin is intact.  Right hand is healing well.  Assessment: G56.01 Carpal tunnel syndrome, right upper limb  Plan: Plan for Procedure(s): CARPAL TUNNEL RELEASE left  The risks benefits and alternatives were discussed with the patient including but not limited to the risks of nonoperative treatment, versus surgical intervention including infection, bleeding, nerve injury,  blood clots, cardiopulmonary complications, morbidity, mortality, among others, and they were willing to proceed.   Park Breed, MD [phone]   05/08/2022 1:07 PM

## 2022-05-27 ENCOUNTER — Encounter
Admission: RE | Admit: 2022-05-27 | Discharge: 2022-05-27 | Disposition: A | Discharge status: Home / Self Care | Payer: PRIVATE HEALTH INSURANCE | Source: Ambulatory Visit | Attending: Specialist | Admitting: Specialist

## 2022-05-27 ENCOUNTER — Other Ambulatory Visit: Payer: Self-pay

## 2022-05-27 HISTORY — DX: Leiomyoma of uterus, unspecified: D25.9

## 2022-05-27 HISTORY — DX: Personal history of urinary calculi: Z87.442

## 2022-05-27 NOTE — Patient Instructions (Signed)
Your procedure is scheduled on: 06/03/22 Report to [place]. To find out your arrival time please call [phone] between 1PM - 3PM on 06/02/22.  Remember: Instructions that are not followed completely may result in serious medical risk, up to and including death, or upon the discretion of your surgeon and anesthesiologist your surgery may need to be rescheduled.     _X__ 1. Do not eat food or drink any liquids after midnight the night before your procedure.                 No gum chewing or hard candies.   __X__2.  On the morning of surgery brush your teeth with toothpaste and water, you                 may rinse your mouth with mouthwash if you wish.  Do not swallow any              toothpaste of mouthwash.     _X__ 3.  No Alcohol for 24 hours before or after surgery.   _X__ 4.  Do Not Smoke or use e-cigarettes For 24 Hours Prior to Your Surgery.                 Do not use any chewable tobacco products for at least 6 hours prior to                 surgery.  ____  5.  Bring all medications with you on the day of surgery if instructed.   __X__  6.  Notify your doctor if there is any change in your medical condition      (cold, fever, infections).     Do not wear jewelry, make-up, hairpins, clips or nail polish. Do not wear lotions, powders, or perfumes. You may wear deodorant Do not shave body hair 48 hours prior to surgery. Men may shave face and neck. Do not bring valuables to the hospital.    [facility] is not responsible for any belongings or valuables.  Contacts, dentures/partials or body piercings may not be worn into surgery. Bring a case for your contacts, glasses or hearing aids, a denture cup will be supplied. Leave your suitcase in the car. After surgery it may be brought to your room. For patients admitted to the hospital, discharge time is determined by your treatment team.   Patients discharged the day of  surgery will not be allowed to drive home.    __X__ Take these medicines the morning of surgery with A SIP OF WATER:    1. omeprazole (PRILOSEC) 20 MG capsule   2.   3.   4.  5.  6.  ____ Fleet Enema (as directed)   ____ Use CHG Soap/SAGE wipes as directed  ____ Use inhalers on the day of surgery  ____ Stop metformin/Janumet/Farxiga 2 days prior to surgery    ____ Take 1/2 of usual insulin dose the night before surgery. No insulin the morning          of surgery.   ____ Stop Blood Thinners Coumadin/Plavix/Xarelto/Pleta/Pradaxa/Eliquis/Effient/Aspirin  on   Or contact your Surgeon, Cardiologist or Medical Doctor regarding  ability to stop your blood thinners  __X__ Stop Anti-inflammatories 7 days before surgery such as Advil, Ibuprofen, Motrin,  BC or Goodies Powder, Naprosyn, Naproxen, Aleve, Aspirin    __X__ Stop all herbals and supplements, fish oil or vitamins  until after surgery.  ____ Bring C-Pap to the hospital.    May shower with Dial "gold" bar or Safeguard soap or come by office to pick up CHG soap.

## 2022-06-03 ENCOUNTER — Encounter: Payer: Self-pay | Admitting: Specialist

## 2022-06-03 ENCOUNTER — Ambulatory Visit: Payer: BC Managed Care – PPO | Admitting: Anesthesiology

## 2022-06-03 ENCOUNTER — Ambulatory Visit
Admission: RE | Admit: 2022-06-03 | Discharge: 2022-06-03 | Disposition: A | Discharge status: Home / Self Care | Payer: BC Managed Care – PPO | Attending: Specialist | Admitting: Specialist

## 2022-06-03 ENCOUNTER — Encounter
Admission: RE | Disposition: A | Discharge status: Home / Self Care | Payer: Self-pay | Source: Home / Self Care | Attending: Specialist

## 2022-06-03 ENCOUNTER — Other Ambulatory Visit: Payer: Self-pay

## 2022-06-03 DIAGNOSIS — E119 Type 2 diabetes mellitus without complications: Secondary | ICD-10-CM | POA: Insufficient documentation

## 2022-06-03 DIAGNOSIS — Z9884 Bariatric surgery status: Secondary | ICD-10-CM | POA: Diagnosis not present

## 2022-06-03 DIAGNOSIS — K589 Irritable bowel syndrome without diarrhea: Secondary | ICD-10-CM | POA: Diagnosis not present

## 2022-06-03 DIAGNOSIS — F17210 Nicotine dependence, cigarettes, uncomplicated: Secondary | ICD-10-CM | POA: Diagnosis not present

## 2022-06-03 DIAGNOSIS — G5602 Carpal tunnel syndrome, left upper limb: Secondary | ICD-10-CM | POA: Insufficient documentation

## 2022-06-03 DIAGNOSIS — I10 Essential (primary) hypertension: Secondary | ICD-10-CM | POA: Diagnosis not present

## 2022-06-03 DIAGNOSIS — K219 Gastro-esophageal reflux disease without esophagitis: Secondary | ICD-10-CM | POA: Diagnosis not present

## 2022-06-03 HISTORY — PX: CARPAL TUNNEL RELEASE: SHX101

## 2022-06-03 LAB — GLUCOSE, CAPILLARY: Glucose-Capillary: 104 mg/dL — ABNORMAL HIGH (ref 70–99)

## 2022-06-03 SURGERY — CARPAL TUNNEL RELEASE
Anesthesia: General | Laterality: Left

## 2022-06-03 MED ORDER — FENTANYL CITRATE (PF) 100 MCG/2ML IJ SOLN
INTRAMUSCULAR | Status: AC
  Filled 2022-06-03: qty 2

## 2022-06-03 MED ORDER — CHLORHEXIDINE GLUCONATE CLOTH 2 % EX PADS
6.0000 | MEDICATED_PAD | Freq: Once | CUTANEOUS | Status: AC
  Administered 2022-06-03: 6 via TOPICAL

## 2022-06-03 MED ORDER — GLYCOPYRROLATE 0.2 MG/ML IJ SOLN
INTRAMUSCULAR | Status: DC | PRN
  Administered 2022-06-03: .2 mg via INTRAVENOUS

## 2022-06-03 MED ORDER — CEFAZOLIN SODIUM-DEXTROSE 2-4 GM/100ML-% IV SOLN
2.0000 g | INTRAVENOUS | Status: AC
  Administered 2022-06-03: 2 g via INTRAVENOUS

## 2022-06-03 MED ORDER — FENTANYL CITRATE (PF) 100 MCG/2ML IJ SOLN: 25.0000 ug | INTRAMUSCULAR | Status: DC | PRN

## 2022-06-03 MED ORDER — CHLORHEXIDINE GLUCONATE 0.12 % MT SOLN
OROMUCOSAL | Status: AC
  Administered 2022-06-03: 15 mL via OROMUCOSAL
  Filled 2022-06-03: qty 15

## 2022-06-03 MED ORDER — PROPOFOL 10 MG/ML IV BOLUS
INTRAVENOUS | Status: DC | PRN
  Administered 2022-06-03: 150 mg via INTRAVENOUS

## 2022-06-03 MED ORDER — LACTATED RINGERS IV SOLN: INTRAVENOUS | Status: DC

## 2022-06-03 MED ORDER — DEXAMETHASONE SODIUM PHOSPHATE 10 MG/ML IJ SOLN
INTRAMUSCULAR | Status: AC
  Filled 2022-06-03: qty 1

## 2022-06-03 MED ORDER — MELOXICAM 7.5 MG PO TABS: 15.0000 mg | ORAL_TABLET | ORAL | Status: DC

## 2022-06-03 MED ORDER — HYDROCODONE-ACETAMINOPHEN 5-325 MG PO TABS: 1.0000 | ORAL_TABLET | Freq: Four times a day (QID) | ORAL | 0 refills | Status: AC | PRN

## 2022-06-03 MED ORDER — OXYCODONE HCL 5 MG PO TABS: 5.0000 mg | ORAL_TABLET | Freq: Once | ORAL | Status: DC | PRN

## 2022-06-03 MED ORDER — MIDAZOLAM HCL 2 MG/2ML IJ SOLN
INTRAMUSCULAR | Status: AC
  Filled 2022-06-03: qty 2

## 2022-06-03 MED ORDER — PHENYLEPHRINE 80 MCG/ML (10ML) SYRINGE FOR IV PUSH (FOR BLOOD PRESSURE SUPPORT)
PREFILLED_SYRINGE | INTRAVENOUS | Status: AC
  Filled 2022-06-03: qty 10

## 2022-06-03 MED ORDER — EPHEDRINE SULFATE (PRESSORS) 50 MG/ML IJ SOLN
INTRAMUSCULAR | Status: DC | PRN
  Administered 2022-06-03: 5 mg via INTRAVENOUS
  Administered 2022-06-03: 10 mg via INTRAVENOUS

## 2022-06-03 MED ORDER — 0.9 % SODIUM CHLORIDE (POUR BTL) OPTIME
TOPICAL | Status: DC | PRN
  Administered 2022-06-03: 150 mL

## 2022-06-03 MED ORDER — ONDANSETRON HCL 4 MG/2ML IJ SOLN
INTRAMUSCULAR | Status: AC
  Filled 2022-06-03: qty 2

## 2022-06-03 MED ORDER — NEOMYCIN-POLYMYXIN B GU 40-200000 IR SOLN
Status: AC
  Filled 2022-06-03: qty 20

## 2022-06-03 MED ORDER — LIDOCAINE HCL (PF) 2 % IJ SOLN
INTRAMUSCULAR | Status: AC
  Filled 2022-06-03: qty 5

## 2022-06-03 MED ORDER — BUPIVACAINE HCL 0.5 % IJ SOLN
INTRAMUSCULAR | Status: DC | PRN
  Administered 2022-06-03: 15 mL

## 2022-06-03 MED ORDER — PROPOFOL 10 MG/ML IV BOLUS
INTRAVENOUS | Status: AC
  Filled 2022-06-03: qty 20

## 2022-06-03 MED ORDER — GABAPENTIN 300 MG PO CAPS
ORAL_CAPSULE | ORAL | Status: AC
  Administered 2022-06-03: 300 mg via ORAL
  Filled 2022-06-03: qty 1

## 2022-06-03 MED ORDER — NEOMYCIN-POLYMYXIN B GU 40-200000 IR SOLN
Status: DC | PRN
  Administered 2022-06-03: 2 mL

## 2022-06-03 MED ORDER — GLYCOPYRROLATE 0.2 MG/ML IJ SOLN
INTRAMUSCULAR | Status: AC
  Filled 2022-06-03: qty 1

## 2022-06-03 MED ORDER — ONDANSETRON HCL 4 MG/2ML IJ SOLN
INTRAMUSCULAR | Status: DC | PRN
  Administered 2022-06-03: 4 mg via INTRAVENOUS

## 2022-06-03 MED ORDER — ORAL CARE MOUTH RINSE: 15.0000 mL | Freq: Once | OROMUCOSAL | Status: AC

## 2022-06-03 MED ORDER — DEXAMETHASONE SODIUM PHOSPHATE 10 MG/ML IJ SOLN
INTRAMUSCULAR | Status: DC | PRN
  Administered 2022-06-03: 5 mg via INTRAVENOUS

## 2022-06-03 MED ORDER — CHLORHEXIDINE GLUCONATE 0.12 % MT SOLN: 15.0000 mL | Freq: Once | OROMUCOSAL | Status: AC

## 2022-06-03 MED ORDER — PROPOFOL 1000 MG/100ML IV EMUL
INTRAVENOUS | Status: AC
  Filled 2022-06-03: qty 100

## 2022-06-03 MED ORDER — ACETAMINOPHEN 10 MG/ML IV SOLN: 1000.0000 mg | Freq: Once | INTRAVENOUS | Status: DC | PRN

## 2022-06-03 MED ORDER — LIDOCAINE HCL (CARDIAC) PF 100 MG/5ML IV SOSY
PREFILLED_SYRINGE | INTRAVENOUS | Status: DC | PRN
  Administered 2022-06-03: 100 mg via INTRAVENOUS

## 2022-06-03 MED ORDER — PHENYLEPHRINE HCL (PRESSORS) 10 MG/ML IV SOLN
INTRAVENOUS | Status: DC | PRN
  Administered 2022-06-03: 160 ug via INTRAVENOUS
  Administered 2022-06-03: 80 ug via INTRAVENOUS
  Administered 2022-06-03: 160 ug via INTRAVENOUS

## 2022-06-03 MED ORDER — BUPIVACAINE HCL (PF) 0.5 % IJ SOLN
INTRAMUSCULAR | Status: AC
  Filled 2022-06-03: qty 30

## 2022-06-03 MED ORDER — ACETAMINOPHEN 10 MG/ML IV SOLN
INTRAVENOUS | Status: DC | PRN
  Administered 2022-06-03: 1000 mg via INTRAVENOUS

## 2022-06-03 MED ORDER — FENTANYL CITRATE (PF) 100 MCG/2ML IJ SOLN
INTRAMUSCULAR | Status: DC | PRN
  Administered 2022-06-03 (×2): 50 ug via INTRAVENOUS

## 2022-06-03 MED ORDER — GABAPENTIN 400 MG PO CAPS: 400.0000 mg | ORAL_CAPSULE | Freq: Three times a day (TID) | ORAL | 3 refills | Status: AC

## 2022-06-03 MED ORDER — CEFAZOLIN SODIUM-DEXTROSE 2-4 GM/100ML-% IV SOLN
INTRAVENOUS | Status: AC
  Filled 2022-06-03: qty 100

## 2022-06-03 MED ORDER — EPHEDRINE 5 MG/ML INJ
INTRAVENOUS | Status: AC
  Filled 2022-06-03: qty 5

## 2022-06-03 MED ORDER — PHENYLEPHRINE HCL (PRESSORS) 10 MG/ML IV SOLN
INTRAVENOUS | Status: AC
  Filled 2022-06-03: qty 1

## 2022-06-03 MED ORDER — PROMETHAZINE HCL 25 MG/ML IJ SOLN: 6.2500 mg | INTRAMUSCULAR | Status: DC | PRN

## 2022-06-03 MED ORDER — MELOXICAM 7.5 MG PO TABS
ORAL_TABLET | ORAL | Status: AC
  Filled 2022-06-03: qty 2

## 2022-06-03 MED ORDER — MIDAZOLAM HCL 2 MG/2ML IJ SOLN
INTRAMUSCULAR | Status: DC | PRN
  Administered 2022-06-03: 2 mg via INTRAVENOUS

## 2022-06-03 MED ORDER — DROPERIDOL 2.5 MG/ML IJ SOLN: 0.6250 mg | Freq: Once | INTRAMUSCULAR | Status: DC | PRN

## 2022-06-03 MED ORDER — GABAPENTIN 300 MG PO CAPS: 300.0000 mg | ORAL_CAPSULE | ORAL | Status: AC

## 2022-06-03 MED ORDER — OXYCODONE HCL 5 MG/5ML PO SOLN: 5.0000 mg | Freq: Once | ORAL | Status: DC | PRN

## 2022-06-03 MED ORDER — ACETAMINOPHEN 10 MG/ML IV SOLN
INTRAVENOUS | Status: AC
  Filled 2022-06-03: qty 100

## 2022-06-03 SURGICAL SUPPLY — 18 items
APL PRP STRL LF DISP 70% ISPRP (MISCELLANEOUS) ×1
BLADE SURG MINI STRL (BLADE) ×2 IMPLANT
BNDG ESMARK 4X12 TAN STRL LF (GAUZE/BANDAGES/DRESSINGS) ×2 IMPLANT
CHLORAPREP W/TINT 26 (MISCELLANEOUS) ×2 IMPLANT
GOWN STRL REUS W/ TWL LRG LVL3 (GOWN DISPOSABLE) ×2 IMPLANT
GOWN STRL REUS W/TWL LRG LVL3 (GOWN DISPOSABLE) ×1
GOWN STRL REUS W/TWL LRG LVL4 (GOWN DISPOSABLE) ×2 IMPLANT
KIT TURNOVER KIT A (KITS) ×2 IMPLANT
NS IRRIG 500ML POUR BTL (IV SOLUTION) ×2 IMPLANT
PACK EXTREMITY ARMC (MISCELLANEOUS) ×2 IMPLANT
PAD PREP 24X41 OB/GYN DISP (PERSONAL CARE ITEMS) ×2 IMPLANT
SPLINT CAST 1 STEP 3X12 (MISCELLANEOUS) ×2 IMPLANT
STOCKINETTE 48X4 2 PLY STRL (GAUZE/BANDAGES/DRESSINGS) ×2 IMPLANT
STOCKINETTE STRL 4IN 9604848 (GAUZE/BANDAGES/DRESSINGS) ×1 IMPLANT
SUT ETHILON 4-0 (SUTURE) ×1
SUT ETHILON 4-0 FS2 18XMFL BLK (SUTURE) ×1
SUT ETHILON 5-0 FS-2 18 BLK (SUTURE) ×2 IMPLANT
SUTURE ETHLN 4-0 FS2 18XMF BLK (SUTURE) ×2 IMPLANT

## 2022-06-03 NOTE — Op Note (Signed)
06/03/2022  11:40 AM  PATIENT:  Kathryn Schneider    PRE-OPERATIVE DIAGNOSIS: LEFT CARPAL TUNNEL SYNDROME  POST-OPERATIVE DIAGNOSIS: LEFT CARPAL TUNNEL SYNDROME  PROCEDURE:  LEFT CARPAL TUNNEL RELEASE  SURGEON: Park Breed, MD  TOURNIQUET TIME: 17  MIN   ANESTHESIA:   General  PREOPERATIVE INDICATIONS:  [name] is a  56 y.o. female with a diagnosis of left carpal tunnel syndrome who failed conservative measures and elected for surgical management.    The risks benefits and alternatives were discussed with the patient preoperatively including but not limited to the risks of infection, bleeding, nerve injury, incomplete relief of symptoms, pillar pain, cardiopulmonary complications, the need for revision surgery, among others, and the patient was willing to proceed.  OPERATIVE FINDINGS: Thickened volar ligament and nerve compression.  OPERATIVE PROCEDURE: The patient is brought to the operating room placed in the supine position. General anesthesia was administered. The left upper extremity was prepped and draped in usual sterile fashion. Time out was performed. The arm was elevated and exsanguinated and the tourniquet was inflated. Incision was made in line with the radial border of the ring finger. The carpal tunnel transverse fascia was identified, cleaned, and incised sharply. The common sensory branches were visualized along with the superficial palmar arch and protected.  The median nerve was protected below. A Kelly clamp was  placed underneath the transverse carpal ligament, protecting the nerve. I released the ligament completely, and then released the proximal distal volar forearm fascia. The nerve was identified, and visualized, and protected throughout the case. The motor branch was intact upon inspection. No masses or abnormalities were identified in the ulnar bursa.  The wounds were irrigated copiously and the skin closed with nylon. The wound was injected with 1/2 %  marcaine followed by a sterile dressing and volar splint. Tourniquet was deflated with good return of blood flow to all fingers. Sponge and needle counts were correct.  The patient tolerated this well, with no complications. The patient was awakened and taken to recovery in good condition.

## 2022-06-03 NOTE — Anesthesia Postprocedure Evaluation (Signed)
Anesthesia Post Note  Patient: Kathryn Schneider  Procedure(s) Performed: CARPAL TUNNEL RELEASE (Left)  Patient location during evaluation: PACU Anesthesia Type: General Level of consciousness: awake and alert Pain management: pain level controlled Vital Signs Assessment: post-procedure vital signs reviewed and stable Respiratory status: spontaneous breathing, nonlabored ventilation and respiratory function stable Cardiovascular status: blood pressure returned to baseline and stable Postop Assessment: no apparent nausea or vomiting Anesthetic complications: no   No notable events documented.   Last Vitals:  Vitals:   06/03/22 1215 06/03/22 1233  BP: 118/71 (!) 139/97  Pulse: (!) 56 (!) 55  Resp: 20 (!) 22  Temp: (!) 36.3 C (!) 36.2 C  SpO2: 95% 99%    Last Pain:  Vitals:   06/03/22 1233  TempSrc: Temporal  PainSc: 0-No pain                 [place] Ouch

## 2022-06-03 NOTE — H&P (Signed)
THE PATIENT WAS SEEN PRIOR TO SURGERY TODAY.  HISTORY, ALLERGIES, HOME MEDICATIONS AND OPERATIVE PROCEDURE WERE REVIEWED. RISKS AND BENEFITS OF SURGERY DISCUSSED WITH PATIENT AGAIN.  NO CHANGES FROM INITIAL HISTORY AND PHYSICAL NOTED.    THE SURGERY HAS BEEN DELAYED  3 HOURS AS THE PATIENT HAD CREAM IN HER COFFEE THIS AM.  ANESTHESIA HAS CLEARED HER NOW.

## 2022-06-03 NOTE — Discharge Instructions (Signed)
Call MD for:  persistant nausea and vomiting Call MD for:  redness, tenderness, or signs of infection (pain, swelling, redness, odor or green/yellow discharge around incision site) Call MD for:  severe uncontrolled pain Call MD for:  temperature >100.4 Diet - low sodium heart healthy Discharge instructions Elevate operative arm at all times Move fingers aggressively RTC as appointed Increase activity slowly  AMBULATORY SURGERY  DISCHARGE INSTRUCTIONS   The drugs that you were given will stay in your system until tomorrow so for the next 24 hours you should not:  Drive an automobile Make any legal decisions Drink any alcoholic beverage   You may resume regular meals tomorrow.  Today it is better to start with liquids and gradually work up to solid foods.  You may eat anything you prefer, but it is better to start with liquids, then soup and crackers, and gradually work up to solid foods.   Please notify your doctor immediately if you have any unusual bleeding, trouble breathing, redness and pain at the surgery site, drainage, fever, or pain not relieved by medication.      Additional Instructions:

## 2022-06-03 NOTE — Transfer of Care (Signed)
Immediate Anesthesia Transfer of Care Note  Patient: Kathryn Schneider  Procedure(s) Performed: CARPAL TUNNEL RELEASE (Left)  Patient Location: PACU  Anesthesia Type:General  Level of Consciousness: awake  Airway & Oxygen Therapy: Patient Spontanous Breathing and Patient connected to face mask oxygen  Post-op Assessment: Report given to RN and Post -op Vital signs reviewed and stable  Post vital signs: Reviewed and stable  Last Vitals:  Vitals Value Taken Time  BP 121/64 06/03/22 1142  Temp 36.3 C 06/03/22 1142  Pulse 57 06/03/22 1145  Resp 10 06/03/22 1145  SpO2 98 % 06/03/22 1145  Vitals shown include unvalidated device data.  Last Pain:  Vitals:   06/03/22 0637  TempSrc: Temporal  PainSc: 10-Worst pain ever         Complications: No notable events documented.

## 2022-06-03 NOTE — Anesthesia Preprocedure Evaluation (Signed)
Anesthesia Evaluation  Patient identified by MRN, date of birth, ID band Patient awake    Reviewed: Allergy & Precautions, NPO status , Patient's Chart, lab work & pertinent test results  Airway Mallampati: II  TM Distance: >3 FB Neck ROM: full    Dental no notable dental hx.    Pulmonary Current Smoker and Patient abstained from smoking.   Pulmonary exam normal        Cardiovascular Exercise Tolerance: Good negative cardio ROS Normal cardiovascular exam     Neuro/Psych  PSYCHIATRIC DISORDERS (unspecified per patient)       Neuromuscular disease (carpal tunnel disease)    GI/Hepatic Neg liver ROS,GERD  Controlled,, Bariatric surgery   Endo/Other  negative endocrine ROSdiabetes    Renal/GU      Musculoskeletal  (+) Arthritis ,  Left hip pain   Abdominal Normal abdominal exam  (+)   Peds  Hematology negative hematology ROS (+)   Anesthesia Other Findings Past Medical History: No date: Allergy No date: Anal fissure No date: Arthritis No date: Bipolar disorder [facility])     Comment:  diagnosed 2000 No date: Depression No date: Diabetes ([place]) 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, uterine No date: Fibromyalgia No date: GERD (gastroesophageal reflux disease) No date: Heart murmur No date: History of kidney stones No date: HTN (hypertension) No date: Hyperlipidemia No date: IBS (irritable bowel syndrome) No date: Kidney stones No date: Obese No date: OCD (obsessive compulsive disorder) No date: Smoker No date: UTI (urinary tract infection)  Past Surgical History: 2013: BREAST BIOPSY; Right     Comment:  neg 04/29/2022: CARPAL TUNNEL RELEASE; Right     Comment:  Procedure: CARPAL TUNNEL RELEASE;  Surgeon: Earnestine Leys, MD;  Location: [facility] ORS;  Service: Orthopedics;                Laterality: Right; No date: COLONOSCOPY No date: FRACTURE SURGERY No date: GASTROPLASTY DUODENAL SWITCH No date: TYMPANOPLASTY No  date: UPPER ENDOSCOPY W/ ANTRODUODENAL MANOMETRY  BMI    Body Mass Index: 25.01 kg/m      Reproductive/Obstetrics negative OB ROS                             Anesthesia Physical Anesthesia Plan  ASA: 2  Anesthesia Plan: General   Post-op Pain Management: Toradol IV (intra-op)*, Gabapentin PO (pre-op)* and Ofirmev IV (intra-op)*   Induction:   PONV Risk Score and Plan: Dexamethasone, Ondansetron, Midazolam and Treatment may vary due to age or medical condition  Airway Management Planned: LMA  Additional Equipment:   Intra-op Plan:   Post-operative Plan: Extubation in OR  Informed Consent: I have reviewed the patients History and Physical, chart, labs and discussed the procedure including the risks, benefits and alternatives for the proposed anesthesia with the patient or authorized representative who has indicated his/her understanding and acceptance.     Dental Advisory Given  Plan Discussed with: Anesthesiologist, CRNA and Surgeon  Anesthesia Plan Comments:         Anesthesia Quick Evaluation

## 2022-06-03 NOTE — Anesthesia Procedure Notes (Signed)
Procedure Name: LMA Insertion Date/Time: 06/03/2022 10:51 AM  Performed by: Cammie Sickle, CRNAPre-anesthesia Checklist: Patient identified, Patient being monitored, Timeout performed, Emergency Drugs available and Suction available Patient Re-evaluated:Patient Re-evaluated prior to induction Oxygen Delivery Method: Circle system utilized Preoxygenation: Pre-oxygenation with 100% oxygen Induction Type: IV induction Ventilation: Mask ventilation without difficulty LMA: LMA inserted LMA Size: 3.0 Tube type: Oral Number of attempts: 1 Placement Confirmation: positive ETCO2 and breath sounds checked- equal and bilateral Tube secured with: Tape Dental Injury: Teeth and Oropharynx as per pre-operative assessment

## 2022-06-04 ENCOUNTER — Encounter: Payer: Self-pay | Admitting: Specialist

## 2022-06-18 ENCOUNTER — Encounter: Payer: Self-pay | Admitting: Specialist
